# Patient Record
Sex: Female | Born: 1945 | Race: Black or African American | Hispanic: No | Smoking: Never smoker
Health system: Southern US, Community
[De-identification: ages and names within clinical notes are randomized; demographics above are authoritative.]

## PROBLEM LIST (undated history)

## (undated) DIAGNOSIS — M1990 Unspecified osteoarthritis, unspecified site: Secondary | ICD-10-CM

## (undated) HISTORY — PX: NO PAST SURGERIES: SHX2092

---

## 2016-02-19 ENCOUNTER — Ambulatory Visit: Payer: Self-pay

## 2016-02-19 ENCOUNTER — Ambulatory Visit (INDEPENDENT_AMBULATORY_CARE_PROVIDER_SITE_OTHER): Payer: PPO | Admitting: Urgent Care

## 2016-02-19 ENCOUNTER — Ambulatory Visit (INDEPENDENT_AMBULATORY_CARE_PROVIDER_SITE_OTHER): Payer: PPO

## 2016-02-19 VITALS — BP 120/76 | HR 84 | Temp 97.9°F | Resp 16 | Ht 63.5 in | Wt 156.0 lb

## 2016-02-19 DIAGNOSIS — M1612 Unilateral primary osteoarthritis, left hip: Secondary | ICD-10-CM

## 2016-02-19 DIAGNOSIS — M25552 Pain in left hip: Secondary | ICD-10-CM | POA: Diagnosis not present

## 2016-02-19 DIAGNOSIS — M1712 Unilateral primary osteoarthritis, left knee: Secondary | ICD-10-CM

## 2016-02-19 DIAGNOSIS — M179 Osteoarthritis of knee, unspecified: Secondary | ICD-10-CM

## 2016-02-19 DIAGNOSIS — M25562 Pain in left knee: Secondary | ICD-10-CM

## 2016-02-19 LAB — POCT GLYCOSYLATED HEMOGLOBIN (HGB A1C): HEMOGLOBIN A1C: 6

## 2016-02-19 MED ORDER — PREDNISONE 20 MG PO TABS: ORAL_TABLET | ORAL | Status: AC

## 2016-02-19 NOTE — Progress Notes (Signed)
MRN: [id number] DOB: June 05, 1946  Subjective:   Julia Vargas is a 70 y.o. female presenting for chief complaint of Hip Pain  Reports several year history of left hip pain. The pain has worsened over time, is like a grinding type sensation, aggravated by long periods of standing. Had a fall on 10/2015, lost footing and fell onto grass, no complications. Also has left knee pain, is intermittent, less painful than her hip pain. Has used glucosamine and Tylenol daily for her pain with some relief of her pain. Denies fever, trauma, history of fractures. Denies history of diabetes.  Julia Vargas has a current medication list which includes the following prescription(s): acetaminophen and glucosamine-chondroitin. Also has No Known Allergies.  Julia Vargas  has no past medical history on file. Also  has no past surgical history on file.  Her family history includes COPD in her father.   Objective:   Vitals: BP 120/76 mmHg  Pulse 84  Temp(Src) 97.9 F (36.6 C) (Oral)  Resp 16  Ht 5' 3.5" (1.613 m)  Wt 156 lb (70.761 kg)  BMI 27.20 kg/m2  SpO2 96%  Physical Exam  Constitutional: She is oriented to person, place, and time. She appears well-developed and well-nourished.  Cardiovascular: Normal rate.   Pulmonary/Chest: Effort normal.  Musculoskeletal:       Left hip: She exhibits decreased range of motion (flexion, external rotation) and tenderness (lateral hip). She exhibits normal strength, no swelling, no crepitus, no deformity and no laceration.       Left knee: She exhibits normal range of motion, no swelling, no effusion, no ecchymosis, no deformity, no laceration, no erythema, normal alignment, no LCL laxity, normal patellar mobility and no bony tenderness. No tenderness found.  Neurological: She is alert and oriented to person, place, and time. She has normal reflexes.  Skin: Skin is warm and dry.   Dg Knee Complete 4 Views Left  02/19/2016  CLINICAL DATA:  70 year old female with left knee  pain. EXAM: LEFT KNEE - COMPLETE 4+ VIEW COMPARISON:  None. FINDINGS: There is no acute fracture or dislocation. There is mild osteopenia. There is mild to moderate osteoarthritic changes with tricompartmental narrowing and bone spurring. There is no joint effusion. The soft tissues appear unremarkable. IMPRESSION: No acute fracture or dislocation. Osteoarthritic changes. Electronically Signed   By: Elgie CollardArash  Radparvar M.D.   On: 02/19/2016 19:01   Dg Hip Unilat W Or W/o Pelvis 2-3 Views Left  02/19/2016  CLINICAL DATA:  Left hip pain EXAM: DG HIP (WITH OR WITHOUT PELVIS) 2-3V LEFT COMPARISON:  None. FINDINGS: No acute fracture or dislocation. Severe osteoarthritis of the left hip with a bone-on-bone appearance, bulky marginal osteophytosis and subchondral sclerosis/ cystic changes. Mild osteoarthritis of the right hip. Osteoarthritis of bilateral sacroiliac joints. IMPRESSION: Severe osteoarthritis of the left hip. Electronically Signed   By: Elige KoHetal  Patel   On: 02/19/2016 19:01   Results for orders placed or performed in visit on 02/19/16 (from the past 24 hour(s))  POCT glycosylated hemoglobin (Hb A1C)     Status: Normal   Collection Time: 02/19/16  6:36 PM  Result Value Ref Range   Hemoglobin A1C 6.0    Assessment and Plan :   1. Osteoarthritis of left hip, unspecified osteoarthritis type 2. Osteoarthritis of left knee, unspecified osteoarthritis type 3. Left hip pain 4. Left knee pain - [facility] hip replacement. Start prednisone for severe hip pain. Use Tylenol in the future for osteoarthritic pain. RTC as  needed.  Wallis Bamberg, PA-C [facility] [phone] 02/19/2016 6:18 PM

## 2016-02-19 NOTE — Patient Instructions (Addendum)
Tylenol You may take  every 6 hours or  every 8 hours.    Osteoarthritis Osteoarthritis is a disease that causes soreness and inflammation of a joint. It occurs when the cartilage at the affected joint wears down. Cartilage acts as a cushion, covering the ends of bones where they meet to form a joint. Osteoarthritis is the most common form of arthritis. It often occurs in older people. The joints affected most often by this condition include those in the:  Ends of the fingers.  Thumbs.  Neck.  Lower back.  Knees.  Hips. CAUSES  Over time, the cartilage that covers the ends of bones begins to wear away. This causes bone to rub on bone, producing pain and stiffness in the affected joints.  RISK FACTORS Certain factors can increase your chances of having osteoarthritis, including:  Older age.  Excessive body weight.  Overuse of joints.  Previous joint injury. SIGNS AND SYMPTOMS   Pain, swelling, and stiffness in the joint.  Over time, the joint may lose its normal shape.  Small deposits of bone (osteophytes) may grow on the edges of the joint.  Bits of bone or cartilage can break off and float inside the joint space. This may cause more pain and damage. DIAGNOSIS  Your health care provider will do a physical exam and ask about your symptoms. Various tests may be ordered, such as:  X-rays of the affected joint.  Blood tests to rule out other types of arthritis. Additional tests may be used to diagnose your condition. TREATMENT  Goals of treatment are to control pain and improve joint function. Treatment plans may include:  A prescribed exercise program that allows for rest and joint relief.  A weight control plan.  Pain relief techniques, such as:  Properly applied heat and cold.  Electric pulses delivered to nerve endings under the skin (transcutaneous electrical nerve stimulation [TENS]).  Massage.  Certain nutritional supplements.  Medicines to  control pain, such as:  Acetaminophen.  Nonsteroidal anti-inflammatory drugs (NSAIDs), such as naproxen.  Narcotic or central-acting agents, such as tramadol.  Corticosteroids. These can be given orally or as an injection.  Surgery to reposition the bones and relieve pain (osteotomy) or to remove loose pieces of bone and cartilage. Joint replacement may be needed in advanced states of osteoarthritis. HOME CARE INSTRUCTIONS   Take medicines only as directed by your health care provider.  Maintain a healthy weight. Follow your health care provider's instructions for weight control. This may include dietary instructions.  Exercise as directed. Your health care provider can recommend specific types of exercise. These may include:  Strengthening exercises. These are done to strengthen the muscles that support joints affected by arthritis. They can be performed with weights or with exercise bands to add resistance.  Aerobic activities. These are exercises, such as brisk walking or low-impact aerobics, that get your heart pumping.  Range-of-motion activities. These keep your joints limber.  Balance and agility exercises. These help you maintain daily living skills.  Rest your affected joints as directed by your health care provider.  Keep all follow-up visits as directed by your health care provider. SEEK MEDICAL CARE IF:   Your skin turns red.  You develop a rash in addition to your joint pain.  You have worsening joint pain.  You have a fever along with joint or muscle aches. SEEK IMMEDIATE MEDICAL CARE IF:  You have a significant loss of weight or appetite.  You have night sweats. FOR MORE INFORMATION  [employer] Arthritis and Musculoskeletal and Skin Diseases: www.niams.http://www.myers.net/nih.gov  [employer] Aging: https://walker.com/www.nia.nih.gov  American College of Rheumatology: www.rheumatology.org   This information is not intended to replace advice given to you by your  health care provider. Make sure you discuss any questions you have with your health care provider.   Document Released: 07/29/2005 Document Revised: 08/19/2014 Document Reviewed: 04/05/2013 Elsevier Interactive Patient Education 2016 [employer]     IF you received an x-ray today, you will receive an invoice from [facility]. Please contact [facility] at [phone] with questions or concerns regarding your invoice.   IF you received labwork today, you will receive an invoice from [facility]. Please contact [facility] at [phone] with questions or concerns regarding your invoice.   Our billing staff will not be able to assist you with questions regarding bills from these companies.  You will be contacted with the lab results as soon as they are available. The fastest way to get your results is to activate your My Chart account. Instructions are located on the last page of this paperwork. If you have not heard from us regarding the results in 2 weeks, please contact this office.    We recommend that you schedule a mammogram for breast cancer screening. Typically, you do not need a referral to do this. Please contact a local imaging center to schedule your mammogram.  [facility] - [phone]) [phone]  *ask for the Radiology Department [facility]) - [phone] or [phone]) [phone]  [facility] - [phone] [facility] - [phone] [facility] - [phone]  *ask for the Radiology Department [facility] - [phone]) [phone]  *ask for the Radiology Department [facility] - [phone]) [phone]  *ask for the Mammography Department [facility] - [phone](336) [phone]

## 2016-03-28 DIAGNOSIS — M1612 Unilateral primary osteoarthritis, left hip: Secondary | ICD-10-CM | POA: Diagnosis not present

## 2016-03-28 DIAGNOSIS — M25552 Pain in left hip: Secondary | ICD-10-CM | POA: Diagnosis not present

## 2016-04-10 DIAGNOSIS — M25552 Pain in left hip: Secondary | ICD-10-CM | POA: Diagnosis not present

## 2016-04-10 DIAGNOSIS — M1612 Unilateral primary osteoarthritis, left hip: Secondary | ICD-10-CM | POA: Diagnosis not present

## 2016-05-02 NOTE — H&P (Signed)
TOTAL HIP ADMISSION H&P  Patient is admitted for left total hip arthroplasty, anterior approach.  Subjective:  Chief Complaint:   Left hip primary OA / pain  HPI: Julia Vargas, 70 y.o. female, has a history of pain and functional disability in the left hip(s) due to arthritis and patient has failed non-surgical conservative treatments for greater than 12 weeks to include NSAID's and/or analgesics, use of assistive devices and activity modification.  Onset of symptoms was gradual starting 40+ years ago with gradually worsening course since that time.The patient noted no past surgery on the left hip(s).  Patient currently rates pain in the left hip at 9 out of 10 with activity. Patient has night pain, worsening of pain with activity and weight bearing, trendelenberg gait, pain that interfers with activities of daily living and pain with passive range of motion. Patient has evidence of periarticular osteophytes and joint space narrowing by imaging studies. This condition presents safety issues increasing the risk of falls.  There is no current active infection.  Risks, benefits and expectations were discussed with the patient.  Risks including but not limited to the risk of anesthesia, blood clots, nerve damage, blood vessel damage, failure of the prosthesis, infection and up to and including death.  Patient understand the risks, benefits and expectations and wishes to proceed with surgery.   PCP: No PCP Per Patient  D/C Plans:      Home  Post-op Meds:       No Rx given  Tranexamic Acid:      To be given - IV   Decadron:      Is to be given  FYI:     ASA  Norco  ** NO BLOOD PRODUCTS **     Past Medical History:  Diagnosis Date  . Arthritis     Past Surgical History:  Procedure Laterality Date  . NO PAST SURGERIES      No prescriptions prior to admission.   No Known Allergies   Social History  Substance Use Topics  . Smoking status: Never Smoker  . Smokeless tobacco: Never Used   . Alcohol use No       Review of Systems  Constitutional: Negative.   HENT: Negative.   Eyes: Negative.   Respiratory: Negative.   Cardiovascular: Negative.   Gastrointestinal: Negative.   Genitourinary: Negative.   Musculoskeletal: Positive for joint pain.  Skin: Negative.   Neurological: Negative.   Endo/Heme/Allergies: Negative.   Psychiatric/Behavioral: Negative.     Objective:  Physical Exam  Constitutional: She is oriented to person, place, and time. She appears well-developed.  HENT:  Head: Normocephalic.  Eyes: Pupils are equal, round, and reactive to light.  Neck: Neck supple. No JVD present. No tracheal deviation present. No thyromegaly present.  Cardiovascular: Normal rate, regular rhythm, normal heart sounds and intact distal pulses.   Respiratory: Effort normal and breath sounds normal. No respiratory distress. She has no wheezes.  GI: Soft. There is no tenderness. There is no guarding.  Musculoskeletal:       Left hip: She exhibits decreased range of motion, decreased strength, tenderness and bony tenderness. She exhibits no swelling, no deformity and no laceration.  Lymphadenopathy:    She has no cervical adenopathy.  Neurological: She is alert and oriented to person, place, and time.  Skin: Skin is warm and dry.  Psychiatric: She has a normal mood and affect.      Imaging Review Plain radiographs demonstrate severe degenerative joint disease of the left  hip(s). The bone quality appears to be good for age and reported activity level.  Assessment/Plan:  End stage arthritis, left hip(s)  The patient history, physical examination, clinical judgement of the provider and imaging studies are consistent with end stage degenerative joint disease of the left hip(s) and total hip arthroplasty is deemed medically necessary. The treatment options including medical management, injection therapy, arthroscopy and arthroplasty were discussed at length. The risks and  benefits of total hip arthroplasty were presented and reviewed. The risks due to aseptic loosening, infection, stiffness, dislocation/subluxation,  thromboembolic complications and other imponderables were discussed.  The patient acknowledged the explanation, agreed to proceed with the plan and consent was signed. Patient is being admitted for inpatient treatment for surgery, pain control, PT, OT, prophylactic antibiotics, VTE prophylaxis, progressive ambulation and ADL's and discharge planning.The patient is planning to be discharged home with home health services.     Anastasio AuerbachMatthew S. Anaiya Wisinski   PA-C  05/13/2016, 7:48 AM

## 2016-05-06 ENCOUNTER — Encounter (HOSPITAL_COMMUNITY)
Admission: RE | Admit: 2016-05-06 | Discharge: 2016-05-06 | Disposition: A | Discharge status: Home / Self Care | Payer: PPO | Source: Ambulatory Visit | Attending: Orthopedic Surgery | Admitting: Orthopedic Surgery

## 2016-05-06 ENCOUNTER — Encounter (HOSPITAL_COMMUNITY): Payer: Self-pay | Admitting: *Deleted

## 2016-05-06 DIAGNOSIS — Z01818 Encounter for other preprocedural examination: Secondary | ICD-10-CM | POA: Diagnosis not present

## 2016-05-06 DIAGNOSIS — Z01812 Encounter for preprocedural laboratory examination: Secondary | ICD-10-CM | POA: Diagnosis not present

## 2016-05-06 DIAGNOSIS — M1612 Unilateral primary osteoarthritis, left hip: Secondary | ICD-10-CM | POA: Diagnosis not present

## 2016-05-06 HISTORY — DX: Unspecified osteoarthritis, unspecified site: M19.90

## 2016-05-06 LAB — BASIC METABOLIC PANEL
Anion gap: 7 (ref 5–15)
BUN: 12 mg/dL (ref 6–20)
CHLORIDE: 108 mmol/L (ref 101–111)
CO2: 27 mmol/L (ref 22–32)
Calcium: 10 mg/dL (ref 8.9–10.3)
Creatinine, Ser: 1.02 mg/dL — ABNORMAL HIGH (ref 0.44–1.00)
GFR calc Af Amer: >60 mL/min (ref >60)
GFR calc non Af Amer: 54 mL/min — ABNORMAL LOW (ref >60)
GLUCOSE: 95 mg/dL (ref 65–99)
POTASSIUM: 3.5 mmol/L (ref 3.5–5.1)
Sodium: 142 mmol/L (ref 135–145)

## 2016-05-06 LAB — CBC
HEMATOCRIT: 42.6 % (ref 36.0–46.0)
Hemoglobin: 13.9 g/dL (ref 12.0–15.0)
MCH: 30.7 pg (ref 26.0–34.0)
MCHC: 32.6 g/dL (ref 30.0–36.0)
MCV: 94 fL (ref 78.0–100.0)
PLATELETS: 178 10*3/uL (ref 150–400)
RBC: 4.53 MIL/uL (ref 3.87–5.11)
RDW: 13.6 % (ref 11.5–15.5)
WBC: 7.4 10*3/uL (ref 4.0–10.5)

## 2016-05-06 LAB — PROTIME-INR
INR: 1.08
Prothrombin Time: 14.1 seconds (ref 11.4–15.2)

## 2016-05-06 LAB — SURGICAL PCR SCREEN
MRSA, PCR: NEGATIVE
Staphylococcus aureus: POSITIVE — AB

## 2016-05-06 NOTE — Progress Notes (Signed)
PCR screening results in epic per PAT visit 05/06/2016 positive for STAPH. Results sent to Dr Charlann Boxerlin. Prescription for Mupriocin Ointment [facility] to [facility]. Pt aware.

## 2016-05-06 NOTE — Patient Instructions (Signed)
Julia Vargas  05/06/2016   Your procedure is scheduled on: Tuesday May 14, 2016  Report to [facility]  Entrance take [place]  elevators to 3rd floor to  Short Stay Center at 8:30  AM.  Call this number if you have problems the morning of surgery [phone]   Remember: ONLY 1 PERSON MAY GO WITH YOU TO SHORT STAY TO GET  READY MORNING OF YOUR SURGERY.  Do not eat food or drink liquids :After Midnight.     Take these medicines the morning of surgery: NONE                                You may not have any metal on your body including hair pins and              piercings  Do not wear jewelry, make-up, lotions, powders or perfumes, deodorant             Do not wear nail polish.  Do not shave  48 hours prior to surgery.                Do not bring valuables to the hospital. [facility] IS NOT             RESPONSIBLE   FOR VALUABLES.  Contacts, dentures or bridgework may not be worn into surgery.  Leave suitcase in the car. After surgery it may be brought to your room.               Please read over the following fact sheets you were given:MRSA INFORMATION SHEET; INCENTIVE SPIROMETER  _____________________________________________________________________             [facility] - Preparing for Surgery Before surgery, you can play an important role.  Because skin is not sterile, your skin needs to be as free of germs as possible.  You can reduce the number of germs on your skin by washing with CHG (chlorahexidine gluconate) soap before surgery.  CHG is an antiseptic cleaner which kills germs and bonds with the skin to continue killing germs even after washing. Please DO NOT use if you have an allergy to CHG or antibacterial soaps.  If your skin becomes reddened/irritated stop using the CHG and inform your nurse when you arrive at Short Stay. Do not shave (including legs and underarms) for at least 48 hours prior to the first CHG shower.  You may shave your  face/neck. Please follow these instructions carefully:  1.  Shower with CHG Soap the night before surgery and the  morning of Surgery.  2.  If you choose to wash your hair, wash your hair first as usual with your  normal  shampoo.  3.  After you shampoo, rinse your hair and body thoroughly to remove the  shampoo.                           4.  Use CHG as you would any other liquid soap.  You can apply chg directly  to the skin and wash                       Gently with a scrungie or clean washcloth.  5.  Apply the CHG Soap to your body ONLY FROM THE  NECK DOWN.   Do not use on face/ open                           Wound or open sores. Avoid contact with eyes, ears mouth and genitals (private parts).                       Wash face,  Genitals (private parts) with your normal soap.             6.  Wash thoroughly, paying special attention to the area where your surgery  will be performed.  7.  Thoroughly rinse your body with warm water from the neck down.  8.  DO NOT shower/wash with your normal soap after using and rinsing off  the CHG Soap.                9.  Pat yourself dry with a clean towel.            10.  Wear clean pajamas.            11.  Place clean sheets on your bed the night of your first shower and do not  sleep with pets. Day of Surgery : Do not apply any lotions/deodorants the morning of surgery.  Please wear clean clothes to the hospital/surgery center.  FAILURE TO FOLLOW THESE INSTRUCTIONS MAY RESULT IN THE CANCELLATION OF YOUR SURGERY PATIENT SIGNATURE_________________________________  NURSE SIGNATURE__________________________________  ________________________________________________________________________   Adam Phenix  An incentive spirometer is a tool that can help keep your lungs clear and active. This tool measures how well you are filling your lungs with each breath. Taking long deep breaths may help reverse or decrease the chance of developing breathing  (pulmonary) problems (especially infection) following:  A long period of time when you are unable to move or be active. BEFORE THE PROCEDURE   If the spirometer includes an indicator to show your best effort, your nurse or respiratory therapist will set it to a desired goal.  If possible, sit up straight or lean slightly forward. Try not to slouch.  Hold the incentive spirometer in an upright position. INSTRUCTIONS FOR USE  1. Sit on the edge of your bed if possible, or sit up as far as you can in bed or on a chair. 2. Hold the incentive spirometer in an upright position. 3. Breathe out normally. 4. Place the mouthpiece in your mouth and seal your lips tightly around it. 5. Breathe in slowly and as deeply as possible, raising the piston or the ball toward the top of the column. 6. Hold your breath for 3-5 seconds or for as long as possible. Allow the piston or ball to fall to the bottom of the column. 7. Remove the mouthpiece from your mouth and breathe out normally. 8. Rest for a few seconds and repeat Steps 1 through 7 at least 10 times every 1-2 hours when you are awake. Take your time and take a few normal breaths between deep breaths. 9. The spirometer may include an indicator to show your best effort. Use the indicator as a goal to work toward during each repetition. 10. After each set of 10 deep breaths, practice coughing to be sure your lungs are clear. If you have an incision (the cut made at the time of surgery), support your incision when coughing by placing a pillow or rolled up towels firmly against it. Once you are able  to get out of bed, walk around indoors and cough well. You may stop using the incentive spirometer when instructed by your caregiver.  RISKS AND COMPLICATIONS  Take your time so you do not get dizzy or light-headed.  If you are in pain, you may need to take or ask for pain medication before doing incentive spirometry. It is harder to take a deep breath if you  are having pain. AFTER USE  Rest and breathe slowly and easily.  It can be helpful to keep track of a log of your progress. Your caregiver can provide you with a simple table to help with this. If you are using the spirometer at home, follow these instructions: SEEK MEDICAL CARE IF:   You are having difficultly using the spirometer.  You have trouble using the spirometer as often as instructed.  Your pain medication is not giving enough relief while using the spirometer.  You develop fever of 100.5 F (38.1 C) or higher. SEEK IMMEDIATE MEDICAL CARE IF:   You cough up bloody sputum that had not been present before.  You develop fever of 102 F (38.9 C) or greater.  You develop worsening pain at or near the incision site. MAKE SURE YOU:   Understand these instructions.  Will watch your condition.  Will get help right away if you are not doing well or get worse. Document Released: 12/09/2006 Document Revised: 10/21/2011 Document Reviewed: 02/09/2007 [facility] Patient Information 2014 [employer] [place].   ________________________________________________________________________

## 2016-05-06 NOTE — Progress Notes (Signed)
Pt did not sign blood refusal consent during PAT visit 05/06/2016. Pt stated she needed to decide on albumin / albumin products prior to signing; pt will need to sign prior to surgical procedure.

## 2016-05-14 ENCOUNTER — Inpatient Hospital Stay (HOSPITAL_COMMUNITY): Payer: PPO | Admitting: Certified Registered Nurse Anesthetist

## 2016-05-14 ENCOUNTER — Inpatient Hospital Stay (HOSPITAL_COMMUNITY)
Admission: RE | Admit: 2016-05-14 | Discharge: 2016-05-16 | DRG: 470 | Disposition: A | Discharge status: Home Health | Payer: PPO | Source: Ambulatory Visit | Attending: Orthopedic Surgery | Admitting: Orthopedic Surgery

## 2016-05-14 ENCOUNTER — Inpatient Hospital Stay (HOSPITAL_COMMUNITY): Payer: PPO

## 2016-05-14 ENCOUNTER — Encounter (HOSPITAL_COMMUNITY): Payer: Self-pay | Admitting: *Deleted

## 2016-05-14 ENCOUNTER — Encounter (HOSPITAL_COMMUNITY)
Admission: RE | Disposition: A | Discharge status: Home Health | Payer: Self-pay | Source: Ambulatory Visit | Attending: Orthopedic Surgery

## 2016-05-14 DIAGNOSIS — M1612 Unilateral primary osteoarthritis, left hip: Secondary | ICD-10-CM | POA: Diagnosis not present

## 2016-05-14 DIAGNOSIS — Z23 Encounter for immunization: Secondary | ICD-10-CM

## 2016-05-14 DIAGNOSIS — M25552 Pain in left hip: Secondary | ICD-10-CM | POA: Diagnosis not present

## 2016-05-14 DIAGNOSIS — Z471 Aftercare following joint replacement surgery: Secondary | ICD-10-CM | POA: Diagnosis not present

## 2016-05-14 DIAGNOSIS — Z96649 Presence of unspecified artificial hip joint: Secondary | ICD-10-CM

## 2016-05-14 DIAGNOSIS — Z96642 Presence of left artificial hip joint: Secondary | ICD-10-CM | POA: Diagnosis not present

## 2016-05-14 DIAGNOSIS — E663 Overweight: Secondary | ICD-10-CM | POA: Diagnosis present

## 2016-05-14 DIAGNOSIS — Z6827 Body mass index (BMI) 27.0-27.9, adult: Secondary | ICD-10-CM | POA: Diagnosis not present

## 2016-05-14 HISTORY — PX: TOTAL HIP ARTHROPLASTY: SHX124

## 2016-05-14 SURGERY — ARTHROPLASTY, HIP, TOTAL, ANTERIOR APPROACH
Anesthesia: Spinal | Site: Hip | Laterality: Left

## 2016-05-14 MED ORDER — CHLORHEXIDINE GLUCONATE 4 % EX LIQD: 60.0000 mL | Freq: Once | CUTANEOUS | Status: DC

## 2016-05-14 MED ORDER — HYDROMORPHONE HCL 1 MG/ML IJ SOLN: 0.2500 mg | INTRAMUSCULAR | Status: DC | PRN

## 2016-05-14 MED ORDER — CEFAZOLIN SODIUM-DEXTROSE 2-4 GM/100ML-% IV SOLN
2.0000 g | Freq: Four times a day (QID) | INTRAVENOUS | Status: AC
  Administered 2016-05-14 (×2): 2 g via INTRAVENOUS
  Filled 2016-05-14 (×2): qty 100

## 2016-05-14 MED ORDER — ONDANSETRON HCL 4 MG/2ML IJ SOLN: 4.0000 mg | Freq: Four times a day (QID) | INTRAMUSCULAR | Status: DC | PRN

## 2016-05-14 MED ORDER — ALUM & MAG HYDROXIDE-SIMETH 200-200-20 MG/5ML PO SUSP: 30.0000 mL | ORAL | Status: DC | PRN

## 2016-05-14 MED ORDER — DOCUSATE SODIUM 100 MG PO CAPS
100.0000 mg | ORAL_CAPSULE | Freq: Two times a day (BID) | ORAL | Status: DC
  Administered 2016-05-14 – 2016-05-15 (×4): 100 mg via ORAL
  Filled 2016-05-14 (×5): qty 1

## 2016-05-14 MED ORDER — POLYETHYLENE GLYCOL 3350 17 G PO PACK
17.0000 g | PACK | Freq: Two times a day (BID) | ORAL | Status: DC
  Administered 2016-05-14 – 2016-05-15 (×4): 17 g via ORAL
  Filled 2016-05-14 (×5): qty 1

## 2016-05-14 MED ORDER — CEFAZOLIN SODIUM-DEXTROSE 2-4 GM/100ML-% IV SOLN
2.0000 g | INTRAVENOUS | Status: AC
  Administered 2016-05-14: 2 g via INTRAVENOUS
  Filled 2016-05-14: qty 100

## 2016-05-14 MED ORDER — BUPIVACAINE HCL (PF) 0.5 % IJ SOLN
INTRAMUSCULAR | Status: DC | PRN
  Administered 2016-05-14: 3 mL

## 2016-05-14 MED ORDER — HYDROCODONE-ACETAMINOPHEN 7.5-325 MG PO TABS
1.0000 | ORAL_TABLET | ORAL | Status: DC
  Administered 2016-05-14 – 2016-05-15 (×4): 1 via ORAL
  Administered 2016-05-15: 2 via ORAL
  Administered 2016-05-15: 1 via ORAL
  Administered 2016-05-15: 2 via ORAL
  Administered 2016-05-16 (×2): 1 via ORAL
  Filled 2016-05-14: qty 1
  Filled 2016-05-14 (×2): qty 2
  Filled 2016-05-14: qty 1
  Filled 2016-05-14 (×2): qty 2
  Filled 2016-05-14: qty 1
  Filled 2016-05-14: qty 2

## 2016-05-14 MED ORDER — PHENYLEPHRINE 40 MCG/ML (10ML) SYRINGE FOR IV PUSH (FOR BLOOD PRESSURE SUPPORT)
PREFILLED_SYRINGE | INTRAVENOUS | Status: AC
Start: 2016-05-14 — End: 2016-05-14
  Filled 2016-05-14: qty 10

## 2016-05-14 MED ORDER — PHENOL 1.4 % MT LIQD: 1.0000 | OROMUCOSAL | Status: DC | PRN

## 2016-05-14 MED ORDER — GLYCOPYRROLATE 0.2 MG/ML IJ SOLN
INTRAMUSCULAR | Status: DC | PRN
  Administered 2016-05-14: 0.2 mg via INTRAVENOUS

## 2016-05-14 MED ORDER — PREDNISONE 20 MG PO TABS
40.0000 mg | ORAL_TABLET | Freq: Every day | ORAL | Status: DC
  Administered 2016-05-16: 40 mg via ORAL
  Filled 2016-05-14 (×2): qty 2

## 2016-05-14 MED ORDER — BISACODYL 10 MG RE SUPP: 10.0000 mg | Freq: Every day | RECTAL | Status: DC | PRN

## 2016-05-14 MED ORDER — PHENYLEPHRINE HCL 10 MG/ML IJ SOLN
INTRAVENOUS | Status: DC | PRN
  Administered 2016-05-14: 40 ug/min via INTRAVENOUS

## 2016-05-14 MED ORDER — PHENYLEPHRINE HCL 10 MG/ML IJ SOLN
INTRAMUSCULAR | Status: DC | PRN
  Administered 2016-05-14 (×4): 40 ug via INTRAVENOUS
  Administered 2016-05-14 (×3): 80 ug via INTRAVENOUS
  Administered 2016-05-14 (×2): 40 ug via INTRAVENOUS
  Administered 2016-05-14: 80 ug via INTRAVENOUS
  Administered 2016-05-14 (×2): 40 ug via INTRAVENOUS

## 2016-05-14 MED ORDER — PROPOFOL 10 MG/ML IV BOLUS
INTRAVENOUS | Status: DC | PRN
  Administered 2016-05-14 (×4): 20 mg via INTRAVENOUS

## 2016-05-14 MED ORDER — FENTANYL CITRATE (PF) 100 MCG/2ML IJ SOLN
INTRAMUSCULAR | Status: AC
  Filled 2016-05-14: qty 2

## 2016-05-14 MED ORDER — ONDANSETRON HCL 4 MG PO TABS: 4.0000 mg | ORAL_TABLET | Freq: Four times a day (QID) | ORAL | Status: DC | PRN

## 2016-05-14 MED ORDER — MIDAZOLAM HCL 5 MG/5ML IJ SOLN
INTRAMUSCULAR | Status: DC | PRN
  Administered 2016-05-14: 2 mg via INTRAVENOUS

## 2016-05-14 MED ORDER — SODIUM CHLORIDE 0.9 % IR SOLN
Status: DC | PRN
  Administered 2016-05-14: 1000 mL

## 2016-05-14 MED ORDER — BUPIVACAINE HCL (PF) 0.5 % IJ SOLN
INTRAMUSCULAR | Status: AC
  Filled 2016-05-14: qty 30

## 2016-05-14 MED ORDER — EPHEDRINE 5 MG/ML INJ
INTRAVENOUS | Status: AC
  Filled 2016-05-14: qty 10

## 2016-05-14 MED ORDER — ONDANSETRON HCL 4 MG/2ML IJ SOLN
INTRAMUSCULAR | Status: DC | PRN
  Administered 2016-05-14: 4 mg via INTRAVENOUS

## 2016-05-14 MED ORDER — METOCLOPRAMIDE HCL 5 MG/ML IJ SOLN: 5.0000 mg | Freq: Three times a day (TID) | INTRAMUSCULAR | Status: DC | PRN

## 2016-05-14 MED ORDER — FENTANYL CITRATE (PF) 100 MCG/2ML IJ SOLN
INTRAMUSCULAR | Status: DC | PRN
  Administered 2016-05-14 (×2): 25 ug via INTRAVENOUS

## 2016-05-14 MED ORDER — METHOCARBAMOL 1000 MG/10ML IJ SOLN
500.0000 mg | Freq: Four times a day (QID) | INTRAVENOUS | Status: DC | PRN
  Filled 2016-05-14: qty 5

## 2016-05-14 MED ORDER — PROMETHAZINE HCL 25 MG/ML IJ SOLN: 6.2500 mg | INTRAMUSCULAR | Status: DC | PRN

## 2016-05-14 MED ORDER — PHENYLEPHRINE 40 MCG/ML (10ML) SYRINGE FOR IV PUSH (FOR BLOOD PRESSURE SUPPORT)
PREFILLED_SYRINGE | INTRAVENOUS | Status: AC
  Filled 2016-05-14: qty 10

## 2016-05-14 MED ORDER — SODIUM CHLORIDE 0.9 % IV SOLN
1000.0000 mg | INTRAVENOUS | Status: AC
  Administered 2016-05-14: 1000 mg via INTRAVENOUS
  Filled 2016-05-14: qty 10

## 2016-05-14 MED ORDER — PROPOFOL 10 MG/ML IV BOLUS
INTRAVENOUS | Status: AC
  Filled 2016-05-14: qty 60

## 2016-05-14 MED ORDER — MIDAZOLAM HCL 2 MG/2ML IJ SOLN
INTRAMUSCULAR | Status: AC
  Filled 2016-05-14: qty 2

## 2016-05-14 MED ORDER — METOCLOPRAMIDE HCL 5 MG PO TABS: 5.0000 mg | ORAL_TABLET | Freq: Three times a day (TID) | ORAL | Status: DC | PRN

## 2016-05-14 MED ORDER — DEXAMETHASONE SODIUM PHOSPHATE 10 MG/ML IJ SOLN
INTRAMUSCULAR | Status: AC
  Filled 2016-05-14: qty 1

## 2016-05-14 MED ORDER — STERILE WATER FOR IRRIGATION IR SOLN
Status: DC | PRN
  Administered 2016-05-14: 2000 mL

## 2016-05-14 MED ORDER — DEXAMETHASONE SODIUM PHOSPHATE 10 MG/ML IJ SOLN
10.0000 mg | Freq: Once | INTRAMUSCULAR | Status: AC
  Administered 2016-05-15: 10 mg via INTRAVENOUS
  Filled 2016-05-14: qty 1

## 2016-05-14 MED ORDER — ASPIRIN 81 MG PO CHEW
81.0000 mg | CHEWABLE_TABLET | Freq: Two times a day (BID) | ORAL | Status: DC
  Administered 2016-05-14 – 2016-05-16 (×4): 81 mg via ORAL
  Filled 2016-05-14 (×4): qty 1

## 2016-05-14 MED ORDER — DIPHENHYDRAMINE HCL 25 MG PO CAPS
25.0000 mg | ORAL_CAPSULE | Freq: Four times a day (QID) | ORAL | Status: DC | PRN
  Administered 2016-05-14: 25 mg via ORAL
  Filled 2016-05-14: qty 1

## 2016-05-14 MED ORDER — POTASSIUM CHLORIDE 2 MEQ/ML IV SOLN
100.0000 mL/h | INTRAVENOUS | Status: DC
  Administered 2016-05-14: 100 mL/h via INTRAVENOUS
  Filled 2016-05-14 (×6): qty 1000

## 2016-05-14 MED ORDER — CEFAZOLIN SODIUM-DEXTROSE 2-4 GM/100ML-% IV SOLN
INTRAVENOUS | Status: AC
  Filled 2016-05-14: qty 100

## 2016-05-14 MED ORDER — PHENYLEPHRINE HCL 10 MG/ML IJ SOLN
INTRAMUSCULAR | Status: AC
Start: 2016-05-14 — End: 2016-05-14
  Filled 2016-05-14: qty 1

## 2016-05-14 MED ORDER — MENTHOL 3 MG MT LOZG: 1.0000 | LOZENGE | OROMUCOSAL | Status: DC | PRN

## 2016-05-14 MED ORDER — CELECOXIB 200 MG PO CAPS
200.0000 mg | ORAL_CAPSULE | Freq: Two times a day (BID) | ORAL | Status: DC
  Administered 2016-05-14 – 2016-05-16 (×5): 200 mg via ORAL
  Filled 2016-05-14 (×5): qty 1

## 2016-05-14 MED ORDER — METHOCARBAMOL 500 MG PO TABS
500.0000 mg | ORAL_TABLET | Freq: Four times a day (QID) | ORAL | Status: DC | PRN
  Administered 2016-05-14 – 2016-05-15 (×2): 500 mg via ORAL
  Filled 2016-05-14 (×2): qty 1

## 2016-05-14 MED ORDER — EPHEDRINE SULFATE 50 MG/ML IJ SOLN
INTRAMUSCULAR | Status: DC | PRN
  Administered 2016-05-14: 10 mg via INTRAVENOUS

## 2016-05-14 MED ORDER — FERROUS SULFATE 325 (65 FE) MG PO TABS
325.0000 mg | ORAL_TABLET | Freq: Three times a day (TID) | ORAL | Status: DC
  Administered 2016-05-14 – 2016-05-16 (×5): 325 mg via ORAL
  Filled 2016-05-14 (×5): qty 1

## 2016-05-14 MED ORDER — MAGNESIUM CITRATE PO SOLN: 1.0000 | Freq: Once | ORAL | Status: DC | PRN

## 2016-05-14 MED ORDER — DEXAMETHASONE SODIUM PHOSPHATE 10 MG/ML IJ SOLN
10.0000 mg | Freq: Once | INTRAMUSCULAR | Status: AC
  Administered 2016-05-14: 10 mg via INTRAVENOUS

## 2016-05-14 MED ORDER — HYDROMORPHONE HCL 1 MG/ML IJ SOLN
0.5000 mg | INTRAMUSCULAR | Status: DC | PRN
  Filled 2016-05-14: qty 1

## 2016-05-14 MED ORDER — LACTATED RINGERS IV SOLN
INTRAVENOUS | Status: DC
  Administered 2016-05-14 (×3): via INTRAVENOUS

## 2016-05-14 MED ORDER — ONDANSETRON HCL 4 MG/2ML IJ SOLN
INTRAMUSCULAR | Status: AC
  Filled 2016-05-14: qty 2

## 2016-05-14 MED ORDER — PROPOFOL 500 MG/50ML IV EMUL
INTRAVENOUS | Status: DC | PRN
  Administered 2016-05-14: 40 ug/kg/min via INTRAVENOUS

## 2016-05-14 SURGICAL SUPPLY — 36 items
BAG ZIPLOCK 12X15 (MISCELLANEOUS) ×3 IMPLANT
CAP HIP TOTAL 2 ×3 IMPLANT
CLOTH BEACON ORANGE TIMEOUT ST (SAFETY) ×3 IMPLANT
COVER PERINEAL POST (MISCELLANEOUS) ×3 IMPLANT
DERMABOND ADVANCED (GAUZE/BANDAGES/DRESSINGS) ×2
DERMABOND ADVANCED .7 DNX12 (GAUZE/BANDAGES/DRESSINGS) ×1 IMPLANT
DRAPE STERI IOBAN 125X83 (DRAPES) ×3 IMPLANT
DRAPE U-SHAPE 47X51 STRL (DRAPES) ×6 IMPLANT
DRESSING AQUACEL AG SP 3.5X10 (GAUZE/BANDAGES/DRESSINGS) ×1 IMPLANT
DRSG AQUACEL AG SP 3.5X10 (GAUZE/BANDAGES/DRESSINGS) ×3
DURAPREP 26ML APPLICATOR (WOUND CARE) ×3 IMPLANT
ELECT REM PT RETURN 9FT ADLT (ELECTROSURGICAL) ×3
ELECTRODE REM PT RTRN 9FT ADLT (ELECTROSURGICAL) ×1 IMPLANT
GLOVE BIOGEL PI IND STRL 7.0 (GLOVE) ×2 IMPLANT
GLOVE BIOGEL PI IND STRL 7.5 (GLOVE) ×3 IMPLANT
GLOVE BIOGEL PI IND STRL 8 (GLOVE) ×1 IMPLANT
GLOVE BIOGEL PI INDICATOR 7.0 (GLOVE) ×4
GLOVE BIOGEL PI INDICATOR 7.5 (GLOVE) ×6
GLOVE BIOGEL PI INDICATOR 8 (GLOVE) ×2
GLOVE ECLIPSE 8.0 STRL XLNG CF (GLOVE) ×6 IMPLANT
GLOVE ORTHO TXT STRL SZ7.5 (GLOVE) ×3 IMPLANT
GLOVE SURG ORTHO 8.0 STRL STRW (GLOVE) ×3 IMPLANT
GLOVE SURG SS PI 6.5 STRL IVOR (GLOVE) ×3 IMPLANT
GLOVE SURG SS PI 7.0 STRL IVOR (GLOVE) ×3 IMPLANT
GOWN STRL REUS W/TWL LRG LVL3 (GOWN DISPOSABLE) ×6 IMPLANT
GOWN STRL REUS W/TWL XL LVL3 (GOWN DISPOSABLE) ×6 IMPLANT
HOLDER FOLEY CATH W/STRAP (MISCELLANEOUS) ×3 IMPLANT
PACK ANTERIOR HIP CUSTOM (KITS) ×3 IMPLANT
SAW OSC TIP CART 19.5X105X1.3 (SAW) ×3 IMPLANT
SUT MNCRL AB 4-0 PS2 18 (SUTURE) ×3 IMPLANT
SUT VIC AB 1 CT1 36 (SUTURE) ×9 IMPLANT
SUT VIC AB 2-0 CT1 27 (SUTURE) ×6
SUT VIC AB 2-0 CT1 TAPERPNT 27 (SUTURE) ×3 IMPLANT
SUT VLOC 180 0 24IN GS25 (SUTURE) ×3 IMPLANT
TRAY FOLEY CATH SILVER 14FR (SET/KITS/TRAYS/PACK) ×3 IMPLANT
YANKAUER SUCT BULB TIP 10FT TU (MISCELLANEOUS) ×3 IMPLANT

## 2016-05-14 NOTE — Progress Notes (Signed)
Left hip xray done per order

## 2016-05-14 NOTE — Anesthesia Preprocedure Evaluation (Addendum)
Anesthesia Evaluation  Patient identified by MRN, date of birth, ID band Patient awake    Reviewed: Allergy & Precautions, NPO status , Patient's Chart, lab work & pertinent test results  Airway Mallampati: II  TM Distance: >3 FB Neck ROM: Full    Dental no notable dental hx.    Pulmonary neg pulmonary ROS,    Pulmonary exam normal breath sounds clear to auscultation       Cardiovascular negative cardio ROS Normal cardiovascular exam Rhythm:Regular Rate:Normal     Neuro/Psych negative neurological ROS  negative psych ROS   GI/Hepatic negative GI ROS, Neg liver ROS,   Endo/Other  negative endocrine ROS  Renal/GU negative Renal ROS  negative genitourinary   Musculoskeletal  (+) Arthritis ,   Abdominal   Peds negative pediatric ROS (+)  Hematology negative hematology ROS (+)   Anesthesia Other Findings   Reproductive/Obstetrics negative OB ROS                             Anesthesia Physical Anesthesia Plan  ASA: II  Anesthesia Plan: Spinal   Post-op Pain Management:    Induction: Intravenous  Airway Management Planned: Natural Airway  Additional Equipment:   Intra-op Plan:   Post-operative Plan:   Informed Consent: I have reviewed the patients History and Physical, chart, labs and discussed the procedure including the risks, benefits and alternatives for the proposed anesthesia with the patient or authorized representative who has indicated his/her understanding and acceptance.   Dental advisory given  Plan Discussed with: CRNA  Anesthesia Plan Comments: (Discussed risks and benefits of and differences between spinal and general. Discussed risks of spinal including headache, backache, failure, bleeding and hematoma, infection, and nerve damage. Patient consents to spinal. Questions answered. Coagulation studies and platelet count acceptable.)        Anesthesia Quick  Evaluation

## 2016-05-14 NOTE — Progress Notes (Signed)
Pt has noted prolasped organ from rectal area. Dr. Charlann Boxerlin at bed-side and will proceed with surgery.

## 2016-05-14 NOTE — Anesthesia Procedure Notes (Signed)
Spinal  Patient location during procedure: OR Start time: 05/14/2016 11:05 AM End time: 05/14/2016 11:10 AM Staffing Resident/CRNA: Kizzie FantasiaARVER, Esker Dever J Performed: resident/CRNA  Preanesthetic Checklist Completed: patient identified, site marked, surgical consent, pre-op evaluation, timeout performed, IV checked, risks and benefits discussed and monitors and equipment checked Spinal Block Patient position: sitting Prep: Betadine Patient monitoring: heart rate Approach: midline Location: L3-4 Injection technique: single-shot Needle Needle type: Spinocan  Needle gauge: 22 G Needle length: 9 cm Needle insertion depth: 7 cm Additional Notes Sterile prep and drape, pt sitting position, negative paresthesia, negative heme.

## 2016-05-14 NOTE — Transfer of Care (Signed)
Immediate Anesthesia Transfer of Care Note  Patient: Julia Vargas  Procedure(s) Performed: Procedure(s): LEFT TOTAL HIP ARTHROPLASTY ANTERIOR APPROACH (Left)  Patient Location: PACU  Anesthesia Type:Spinal  Level of Consciousness:  sedated, patient cooperative and responds to stimulation  Airway & Oxygen Therapy:Patient Spontanous Breathing and Patient connected to face mask oxgen  Post-op Assessment:  Report given to PACU RN and Post -op Vital signs reviewed and stable  Post vital signs:  Reviewed and stable  Last Vitals:  Vitals:   05/14/16 0755 05/14/16 0825  BP: (!) 151/103 (!) 154/90  Pulse: 88   Resp: 16   Temp: 36.4 C     Complications: No apparent anesthesia complications

## 2016-05-14 NOTE — Anesthesia Postprocedure Evaluation (Signed)
Anesthesia Post Note  Patient: Julia Vargas  Procedure(s) Performed: Procedure(s) (LRB): LEFT TOTAL HIP ARTHROPLASTY ANTERIOR APPROACH (Left)  Patient location during evaluation: PACU Anesthesia Type: Spinal Level of consciousness: oriented and awake and alert Pain management: pain level controlled Vital Signs Assessment: post-procedure vital signs reviewed and stable Respiratory status: spontaneous breathing, respiratory function stable and patient connected to nasal cannula oxygen Cardiovascular status: blood pressure returned to baseline and stable Postop Assessment: no headache, no backache, spinal receding and patient able to bend at knees Anesthetic complications: no    Last Vitals:  Vitals:   05/14/16 1415 05/14/16 1430  BP: 114/74 117/73  Pulse: 69 (!) 59  Resp: 20 (!) 21  Temp:  36.3 C    Last Pain:  Vitals:   05/14/16 1430  TempSrc:   PainSc: 0-No pain                 Pearly Bartosik J

## 2016-05-14 NOTE — Interval H&P Note (Signed)
History and Physical Interval Note:  05/14/2016 10:02 AM  Julia Vargas  has presented today for surgery, with the diagnosis of LEFT HIP OA  The various methods of treatment have been discussed with the patient and family. After consideration of risks, benefits and other options for treatment, the patient has consented to  Procedure(s): LEFT TOTAL HIP ARTHROPLASTY ANTERIOR APPROACH (Left) as a surgical intervention .  The patient's history has been reviewed, patient examined, no change in status, stable for surgery.  I have reviewed the patient's chart and labs.  Questions were answered to the patient's satisfaction.     Shelda PalLIN,Azaylea Maves D

## 2016-05-14 NOTE — Op Note (Signed)
NAME:  Julia Vargas                ACCOUNT NO.: [id number]      MEDICAL RECORD NO.: [id number]      [facility]      PHYSICIAN:  Durene RomansLIN,Khamiya Varin D  DATE OF BIRTH:  Mar 14, 1946     DATE OF PROCEDURE:  05/14/2016                                 OPERATIVE REPORT         PREOPERATIVE DIAGNOSIS: Left  hip osteoarthritis.      POSTOPERATIVE DIAGNOSIS:  Left hip osteoarthritis.      PROCEDURE:  Left total hip replacement through an anterior approach   utilizing Paxeon THR system, component size 52mm Logical cup, a size 36 lateralized liner, a size 1 HO Remedy stem with a 36 M delta ceramic   ball.      SURGEON:  Madlyn FrankelMatthew D. Charlann Boxerlin, M.D.      ASSISTANT:  Leilani AbleSteve Chabon, PA-C     ANESTHESIA:  Spinal.      SPECIMENS:  None.      COMPLICATIONS:  None.      BLOOD LOSS:  550 cc     DRAINS:  None.      INDICATION OF THE PROCEDURE:  Julia Vargas is a 70 y.o. female who had   presented to office for evaluation of left hip pain.  Radiographs revealed   progressive degenerative changes with bone-on-bone   articulation to the  hip joint.  The patient had painful limited range of   motion significantly affecting their overall quality of life.  The patient was failing to    respond to conservative measures, and at this point was ready   to proceed with more definitive measures.  The patient has noted progressive   degenerative changes in his hip, progressive problems and dysfunction   with regarding the hip prior to surgery.  Consent was obtained for   benefit of pain relief.  Specific risk of infection, DVT, component   failure, dislocation, need for revision surgery, as well discussion of   the anterior versus posterior approach were reviewed.  Consent was   obtained for benefit of anterior pain relief through an anterior   approach.      PROCEDURE IN DETAIL:  The patient was brought to operative theater.   Once adequate anesthesia, preoperative antibiotics, 2gm of  Ancef, 1 gm of Tranexamic Acid, and 10 mg of Decadron administered.   The patient was positioned supine on the OSI Hanna table.  Once adequate   padding of boney process was carried out, we had predraped out the hip, and  used fluoroscopy to confirm orientation of the pelvis and position.      The left hip was then prepped and draped from proximal iliac crest to   mid thigh with shower curtain technique.      Time-out was performed identifying the patient, planned procedure, and   extremity.     An incision was then made 2 cm distal and lateral to the   anterior superior iliac spine extending over the orientation of the   tensor fascia lata muscle and sharp dissection was carried down to the   fascia of the muscle and protractor placed in the soft tissues.      The fascia was then incised.  The muscle  belly was identified and swept   laterally and retractor placed along the superior neck.  Following   cauterization of the circumflex vessels and removing some pericapsular   fat, a second cobra retractor was placed on the inferior neck.  A third   retractor was placed on the anterior acetabulum after elevating the   anterior rectus.  A L-capsulotomy was along the line of the   superior neck to the trochanteric fossa, then extended proximally and   distally.  Tag sutures were placed and the retractors were then placed   intracapsular.  We then identified the trochanteric fossa and   orientation of my neck cut, confirmed this radiographically   and then made a neck osteotomy with the femur on traction.  The femoral   head was removed without difficulty or complication.  Traction was let   off and retractors were placed posterior and anterior around the   acetabulum.      The labrum and foveal tissue were debrided.  I began reaming with a 44mm   reamer and reamed up to 52mm reamer with good bony bed preparation and a 52mm   cup was chosen.  The final 52mm Logical cup was then impacted  under fluoroscopy  to confirm the depth of penetration and orientation with respect to   abduction.  A screw was placed followed by the hole eliminator.  The final   36 neutral lateralized liner was impacted with good visualized rim fit.  The cup was positioned anatomically within the acetabular portion of the pelvis.      At this point, the femur was rolled at 80 degrees.  Further capsule was   released off the inferior aspect of the femoral neck.  I then   released the superior capsule proximally.  The hook was placed laterally   along the femur and elevated manually and held in position with the bed   hook.  The leg was then extended and adducted with the leg rolled to 100   degrees of external rotation.  Once the proximal femur was fully   exposed, I used a box osteotome to set orientation.  I then began   broaching with the starting chili pepper broach and passed this by hand and then broached up to 1.  With size 1 broach in place I chose a high offset neck and did a trial reduction.  The offset was appropriate, leg lengths   appeared to be equal, confirmed radiographically.   Given these findings, I went ahead and dislocated the hip, repositioned all   retractors and positioned the right hip in the extended and abducted position.  The final 1 HO Remedy stem was   chosen and it was impacted down to the level of neck cut.  Based on this   and the trial reduction, a 36 M ceramic ball was chosen and   impacted onto a clean and dry trunnion, and the hip was reduced.  The   hip had been irrigated throughout the case again at this point.  I did   reapproximate the superior capsular leaflet to the anterior leaflet   using #1 Vicryl.  The fascia of the   tensor fascia lata muscle was then reapproximated using #1 Vicryl and #0 V-lock sutures.  The   remaining wound was closed with 2-0 Vicryl and running 4-0 Monocryl.   The hip was cleaned, dried, and dressed sterilely using Dermabond and    Aquacel dressing.  She was then  brought   to recovery room in stable condition tolerating the procedure well.    Leilani Able, PA-C was present for the entirety of the case involved from   preoperative positioning, perioperative retractor management, general   facilitation of the case, as well as primary wound closure as assistant.            Madlyn Frankel Charlann Boxer, M.D.        05/14/2016 1:02 PM

## 2016-05-15 DIAGNOSIS — Z6827 Body mass index (BMI) 27.0-27.9, adult: Secondary | ICD-10-CM | POA: Diagnosis not present

## 2016-05-15 DIAGNOSIS — E663 Overweight: Secondary | ICD-10-CM | POA: Diagnosis present

## 2016-05-15 DIAGNOSIS — M1612 Unilateral primary osteoarthritis, left hip: Secondary | ICD-10-CM | POA: Diagnosis not present

## 2016-05-15 LAB — BASIC METABOLIC PANEL
ANION GAP: 6 (ref 5–15)
BUN: 11 mg/dL (ref 6–20)
CALCIUM: 9.1 mg/dL (ref 8.9–10.3)
CO2: 25 mmol/L (ref 22–32)
CREATININE: 0.83 mg/dL (ref 0.44–1.00)
Chloride: 108 mmol/L (ref 101–111)
GFR calc Af Amer: >60 mL/min (ref >60)
GLUCOSE: 141 mg/dL — AB (ref 65–99)
Potassium: 3.9 mmol/L (ref 3.5–5.1)
Sodium: 139 mmol/L (ref 135–145)

## 2016-05-15 LAB — CBC
HCT: 33.1 % — ABNORMAL LOW (ref 36.0–46.0)
Hemoglobin: 11.1 g/dL — ABNORMAL LOW (ref 12.0–15.0)
MCH: 30 pg (ref 26.0–34.0)
MCHC: 33.5 g/dL (ref 30.0–36.0)
MCV: 89.5 fL (ref 78.0–100.0)
PLATELETS: 169 10*3/uL (ref 150–400)
RBC: 3.7 MIL/uL — ABNORMAL LOW (ref 3.87–5.11)
RDW: 13.1 % (ref 11.5–15.5)
WBC: 12.6 10*3/uL — AB (ref 4.0–10.5)

## 2016-05-15 MED ORDER — FERROUS SULFATE 325 (65 FE) MG PO TABS: 325.0000 mg | ORAL_TABLET | Freq: Three times a day (TID) | ORAL | 3 refills | Status: AC

## 2016-05-15 MED ORDER — TIZANIDINE HCL 4 MG PO TABS: 4.0000 mg | ORAL_TABLET | Freq: Four times a day (QID) | ORAL | 0 refills | Status: AC | PRN

## 2016-05-15 MED ORDER — DOCUSATE SODIUM 100 MG PO CAPS: 100.0000 mg | ORAL_CAPSULE | Freq: Two times a day (BID) | ORAL | 0 refills | Status: AC

## 2016-05-15 MED ORDER — POLYETHYLENE GLYCOL 3350 17 G PO PACK: 17.0000 g | PACK | Freq: Two times a day (BID) | ORAL | 0 refills | Status: AC

## 2016-05-15 MED ORDER — HYDROCODONE-ACETAMINOPHEN 7.5-325 MG PO TABS: 1.0000 | ORAL_TABLET | ORAL | 0 refills | Status: AC | PRN

## 2016-05-15 MED ORDER — INFLUENZA VAC SPLIT QUAD 0.5 ML IM SUSY
0.5000 mL | PREFILLED_SYRINGE | INTRAMUSCULAR | Status: AC
  Administered 2016-05-16: 0.5 mL via INTRAMUSCULAR
  Filled 2016-05-15: qty 0.5

## 2016-05-15 MED ORDER — ASPIRIN 81 MG PO CHEW: 81.0000 mg | CHEWABLE_TABLET | Freq: Two times a day (BID) | ORAL | 0 refills | Status: AC

## 2016-05-15 NOTE — Progress Notes (Signed)
   05/15/16 1200  PT Visit Information  Last PT Received On 05/15/16  Assistance Needed +1  History of Present Illness s/p L DA  THA  Subjective Data  Patient Stated Goal less pain  Restrictions  Weight Bearing Restrictions No  Other Position/Activity Restrictions WBAT  Pain Assessment  Pain Assessment 0-10  Pain Score 5  Pain Location L hip  Pain Descriptors / Indicators Grimacing;Guarding;Sore  Pain Intervention(s) Limited activity within patient's tolerance;Monitored during session;Premedicated before session  Cognition  Arousal/Alertness Awake/alert  Behavior During Therapy WFL for tasks assessed/performed  Overall Cognitive Status Within Functional Limits for tasks assessed  Bed Mobility  General bed mobility comments (NT)  Transfers  Overall transfer level Needs assistance  Equipment used Rolling walker (2 wheeled)  Sit to Stand Min assist  General transfer comment assist to rise and stabilize in standing; cues for hand placement and wt shift; has difficulty with functional hip flexion/anterior st shift  Ambulation/Gait  Ambulation/Gait assistance Min guard;Min assist  Ambulation Distance (Feet) 120 Feet  Assistive device Rolling walker (2 wheeled)  Gait Pattern/deviations Step-to pattern;Step-through pattern;Decreased stride length  General Gait Details assist for balance throughout, pt generally unsteady but balance improved with incr distance; cues for sequence and RW position  Balance  Sitting balance - Comments requires UE support or assist to maintain balance  Standing balance-Leahy Scale Poor  Total Joint Exercises  Ankle Circles/Pumps AROM;Both;10 reps  Heel Slides AROM;AAROM;Both;10 reps  Hip ABduction/ADduction AROM;AAROM;Both;10 reps  [employer]ad Sets AROM;Strengthening;Both;10 reps  [employer] Quad AROM;Left;10 reps  PT - End of Session  Equipment Utilized During Treatment Gait belt  Activity Tolerance Patient tolerated treatment well  Patient left in chair;with  call bell/phone within reach;with chair alarm set  PT - Assessment/Plan  PT Plan Current plan remains appropriate  PT Frequency (ACUTE ONLY) 7X/week  Follow Up Recommendations Home health PT;Supervision for mobility/OOB  PT equipment Rolling walker with 5" wheels;3in1 (PT)  PT Goal Progression  Progress towards PT goals Progressing toward goals  Acute Rehab PT Goals  PT Goal Formulation With patient  Time For Goal Achievement 05/20/16  Potential to Achieve Goals Good  PT Time Calculation  PT Start Time (ACUTE ONLY) 1140  PT Stop Time (ACUTE ONLY) 1159  PT Time Calculation (min) (ACUTE ONLY) 19 min  PT General Charges  $$ ACUTE PT VISIT 1 Procedure  PT Treatments  $Gait Training 8-22 mins

## 2016-05-15 NOTE — Evaluation (Signed)
Occupational Therapy Evaluation Patient Details Name: Dewain PenningOneata Gural MRN: [id number] DOB: Apr 12, 1946 Today's Date: 05/15/2016    History of Present Illness s/p L DA  THA   Clinical Impression   This 70 year old female was admitted for the above sx. She will have intermittent assistance at home from son and church friends.  Pt wants to be mod I with sponge bathing and donning pants/underwear as well as toileting as she will be alone part of day.  Goals are for supervision to min guard level in acute. Recommend continued HHOT for increased safety and independence at home    Follow Up Recommendations  Home health OT    Equipment Recommendations  3 in 1 bedside comode    Recommendations for Other Services       Precautions / Restrictions Precautions Precautions: Fall Restrictions Weight Bearing Restrictions: No Other Position/Activity Restrictions: WBAT      Mobility Bed Mobility Overal bed mobility: Needs Assistance           General bed mobility comments: oob (NT)  Transfers Overall transfer level: Needs assistance Equipment used: Rolling walker (2 wheeled) Transfers: Sit to/from Stand Sit to Stand: Min assist         General transfer comment: pt leans posteriorly when standing and initially had LOB.  Min A to rise and stabilize.  Cues for UEs    Balance Overall balance assessment: Needs assistance Sitting-balance support: Feet supported Sitting balance-Leahy Scale: Poor Sitting balance - Comments: requires UE support or assist to maintain balance     Standing balance-Leahy Scale: Poor Standing balance comment: reliant on UEs                             ADL Overall ADL's : Needs assistance/impaired     Grooming: Set up;Sitting       Lower Body Bathing: Minimal assistance;Sit to/from stand;With adaptive equipment       Lower Body Dressing: Minimal assistance;Sit to/from stand;With adaptive equipment                 General  ADL Comments: educated on and practiced with reacher. She will have son don her socks. She can perform UB adls with set up.  Walked forward and backwards a couple of steps as she was still connected to 02 and sats were low 90s.  Pt plans to sponge bathe at home     Vision     Perception     Praxis      Pertinent Vitals/Pain Pain Assessment: 0-10 Pain Score: 5  Pain Location: L hip Pain Descriptors / Indicators: Grimacing;Guarding;Sore Pain Intervention(s): Limited activity within patient's tolerance;Monitored during session;Premedicated before session     Hand Dominance     Extremity/Trunk Assessment Upper Extremity Assessment Upper Extremity Assessment: Overall WFL for tasks assessed          Communication Communication Communication: No difficulties   Cognition Arousal/Alertness: Awake/alert Behavior During Therapy: WFL for tasks assessed/performed Overall Cognitive Status: Within Functional Limits for tasks assessed                     General Comments       Exercises       Shoulder Instructions      Home Living Family/patient expects to be discharged to:: Private residence Living Arrangements: Children (son) Available Help at Discharge: Family;Available PRN/intermittently  Bathroom Shower/Tub: Therapist, sports characteristics: Teacher, early years/pre: None   Additional Comments: church friends will help intermittently      Prior Functioning/Environment Level of Independence: Independent                 OT Problem List: Decreased strength;Decreased activity tolerance;Impaired balance (sitting and/or standing);Decreased knowledge of use of DME or AE;Pain   OT Treatment/Interventions: Self-care/ADL training;DME and/or AE instruction;Balance training;Patient/family education    OT Goals(Current goals can be found in the care plan section) Acute Rehab OT Goals Patient Stated  Goal: regain independence OT Goal Formulation: With patient Time For Goal Achievement: 05/22/16 Potential to Achieve Goals: Good ADL Goals Pt Will Perform Grooming: with supervision;standing Pt Will Perform Lower Body Bathing: with min guard assist;sit to/from stand;with adaptive equipment Pt Will Perform Lower Body Dressing: with min guard assist;with adaptive equipment;sit to/from stand Pt Will Transfer to Toilet: ambulating;bedside commode Pt Will Perform Toileting - Clothing Manipulation and hygiene: with min guard assist;sit to/from stand  OT Frequency: Min 2X/week   Barriers to D/C:            Co-evaluation              End of Session    Activity Tolerance: Patient tolerated treatment well Patient left: in chair;with call bell/phone within reach;with chair alarm set   Time: 1610-9604 OT Time Calculation (min): 26 min Charges:  OT General Charges $OT Visit: 1 Procedure OT Evaluation $OT Eval Low Complexity: 1 Procedure OT Treatments $Self Care/Home Management : 8-22 mins G-Codes:    Irineo Gaulin 2016-05-20, 1:02 PM Marica Otter, OTR/L [phone] 2016/05/20

## 2016-05-15 NOTE — Evaluation (Signed)
Physical Therapy Evaluation Patient Details Name: Julia Vargas MRN: [id number] DOB: 04/21/1946 Today's Date: 05/15/2016   History of Present Illness  s/p L DA  THA  Clinical Impression  Pt is s/p THA resulting in the deficits listed below (see PT Problem List). * Pt will benefit from skilled PT to increase their independence and safety with mobility to allow discharge to the venue listed below.  Plan is for HHPT per MD     Follow Up Recommendations Home health PT;Supervision for mobility/OOB    Equipment Recommendations  Rolling walker with 5" wheels;3in1 (PT)    Recommendations for Other Services       Precautions / Restrictions Precautions Precautions: Fall Restrictions Weight Bearing Restrictions: No Other Position/Activity Restrictions: WBAT      Mobility  Bed Mobility Overal bed mobility: Needs Assistance Bed Mobility: Supine to Sit     Supine to sit: Min assist;Mod assist     General bed mobility comments: incr time, assist with trunk and LLE  Transfers Overall transfer level: Needs assistance Equipment used: Rolling walker (2 wheeled) Transfers: Sit to/from Stand Sit to Stand: Min assist;From elevated surface         General transfer comment: assist to rise and stabilize in standing; cues for hand placement and wt shift  Ambulation/Gait Ambulation/Gait assistance: Min assist Ambulation Distance (Feet): 70 Feet Assistive device: Rolling walker (2 wheeled) Gait Pattern/deviations: Step-to pattern;Wide base of support;Decreased step length - right;Decreased step length - left;Decreased weight shift to left     General Gait Details: assist for balance throughout, pt generally unsteady but balance improved with incr distance; cues for sequence and RW position  Stairs            Wheelchair Mobility    Modified Rankin (Stroke Patients Only)       Balance Overall balance assessment: Needs assistance Sitting-balance support: Feet  supported Sitting balance-Leahy Scale: Poor Sitting balance - Comments: requires UE support or assist to maintain balance     Standing balance-Leahy Scale: Poor Standing balance comment: reliant on UEs                              Pertinent Vitals/Pain Pain Assessment: 0-10 Pain Score: 2  Pain Location: L hip  Pain Descriptors / Indicators: Sore Pain Intervention(s): Limited activity within patient's tolerance;Monitored during session;Premedicated before session    Home Living Family/patient expects to be discharged to:: Private residence Living Arrangements: Children (lives with son, works days) Available Help at Discharge: Family;Available PRN/intermittently Type of Home: Apartment Home Access: Stairs to enter   Entrance Stairs-Number of Steps: 17 Home Layout: One level Home Equipment: None      Prior Function Level of Independence: Independent               Hand Dominance        Extremity/Trunk Assessment   Upper Extremity Assessment: Defer to OT evaluation;Overall WFL for tasks assessed           Lower Extremity Assessment: LLE deficits/detail   LLE Deficits / Details: ankle WFL; hip flexion to ~70* knee flexion/extension grossly WFL     Communication   Communication: No difficulties  Cognition Arousal/Alertness: Awake/alert Behavior During Therapy: WFL for tasks assessed/performed Overall Cognitive Status: Within Functional Limits for tasks assessed                      General Comments      Exercises  Assessment/Plan    PT Assessment Patient needs continued PT services  PT Problem List Decreased strength;Decreased activity tolerance;Decreased mobility;Decreased range of motion;Decreased balance;Decreased knowledge of use of DME          PT Treatment Interventions DME instruction;Gait training;Functional mobility training;Stair training;Therapeutic activities;Therapeutic exercise;Patient/family education    PT  Goals (Current goals can be found in the Care Plan section)  Acute Rehab PT Goals Patient Stated Goal: less pain PT Goal Formulation: With patient Time For Goal Achievement: 05/20/16 Potential to Achieve Goals: Good    Frequency 7X/week   Barriers to discharge        Co-evaluation               End of Session Equipment Utilized During Treatment: Gait belt Activity Tolerance: Patient tolerated treatment well Patient left: in bed;with call bell/phone within reach           Time: 0852-0920 PT Time Calculation (min) (ACUTE ONLY): 28 min   Charges:   PT Evaluation $PT Eval Low Complexity: 1 Procedure PT Treatments $Gait Training: 8-22 mins   PT G Codes:        Maile Linford 05/15/2016, 9:53 AM

## 2016-05-15 NOTE — Progress Notes (Signed)
     Subjective: 1 Day Post-Op Procedure(s) (LRB): LEFT TOTAL HIP ARTHROPLASTY ANTERIOR APPROACH (Left)   Patient reports pain as mild, pain controlled. No events throughout the night.   Objective:   VITALS:   Vitals:   05/15/16 0210 05/15/16 0615  BP: 119/68 110/63  Pulse: 64 62  Resp: 16 16  Temp: 98.2 F (36.8 C) 98 F (36.7 C)    Dorsiflexion/Plantar flexion intact Incision: dressing C/D/I No cellulitis present Compartment soft  LABS  Recent Labs  05/15/16 0449  HGB 11.1*  HCT 33.1*  WBC 12.6*  PLT 169     Recent Labs  05/15/16 0449  NA 139  K 3.9  BUN 11  CREATININE 0.83  GLUCOSE 141*     Assessment/Plan: 1 Day Post-Op Procedure(s) (LRB): LEFT TOTAL HIP ARTHROPLASTY ANTERIOR APPROACH (Left) Foley cath d/c'ed Advance diet Up with therapy D/C IV fluids Discharge home with home health, eventually when ready  Overweight (BMI 25-29.9) Estimated body mass index is 27.46 kg/m as calculated from the following:   Height as of this encounter: 5\' 3"  (1.6 m).   Weight as of this encounter: 70.3 kg (155 lb). Patient also counseled that weight may inhibit the healing process Patient counseled that losing weight will help with future health issues        Anastasio AuerbachMatthew S. Jaley Yan   PAC  05/15/2016, 9:02 AM

## 2016-05-15 NOTE — Discharge Instructions (Signed)

## 2016-05-16 DIAGNOSIS — R269 Unspecified abnormalities of gait and mobility: Secondary | ICD-10-CM | POA: Diagnosis not present

## 2016-05-16 DIAGNOSIS — Z23 Encounter for immunization: Secondary | ICD-10-CM | POA: Diagnosis not present

## 2016-05-16 LAB — BASIC METABOLIC PANEL
ANION GAP: 4 — AB (ref 5–15)
BUN: 19 mg/dL (ref 6–20)
CHLORIDE: 111 mmol/L (ref 101–111)
CO2: 25 mmol/L (ref 22–32)
Calcium: 8.9 mg/dL (ref 8.9–10.3)
Creatinine, Ser: 1.19 mg/dL — ABNORMAL HIGH (ref 0.44–1.00)
GFR calc non Af Amer: 45 mL/min — ABNORMAL LOW (ref >60)
GFR, EST AFRICAN AMERICAN: 52 mL/min — AB (ref >60)
Glucose, Bld: 104 mg/dL — ABNORMAL HIGH (ref 65–99)
POTASSIUM: 3.7 mmol/L (ref 3.5–5.1)
SODIUM: 140 mmol/L (ref 135–145)

## 2016-05-16 LAB — CBC
HCT: 32.3 % — ABNORMAL LOW (ref 36.0–46.0)
HEMOGLOBIN: 10.7 g/dL — AB (ref 12.0–15.0)
MCH: 30.9 pg (ref 26.0–34.0)
MCHC: 33.1 g/dL (ref 30.0–36.0)
MCV: 93.4 fL (ref 78.0–100.0)
Platelets: 173 10*3/uL (ref 150–400)
RBC: 3.46 MIL/uL — AB (ref 3.87–5.11)
RDW: 13.8 % (ref 11.5–15.5)
WBC: 16.2 10*3/uL — ABNORMAL HIGH (ref 4.0–10.5)

## 2016-05-16 NOTE — Progress Notes (Signed)
Physical Therapy Treatment Patient Details Name: Julia PenningOneata Mondor MRN: [id number] DOB: 1945/11/05 Today's Date: 05/16/2016    History of Present Illness s/p L DA  THA    PT Comments    POD # 2 Assisted with amb in hallway, practiced stairs then performed all THR TE's following HEP.  Instructed on proper tech and freq as well as use of ICE.  Pt ready for D/C to home.   Follow Up Recommendations  Home health PT;Supervision for mobility/OOB     Equipment Recommendations  Rolling walker with 5" wheels;3in1 (PT)    Recommendations for Other Services       Precautions / Restrictions Precautions Precautions: Fall Restrictions Weight Bearing Restrictions: No Other Position/Activity Restrictions: WBAT    Mobility  Bed Mobility               General bed mobility comments: oob in recliner  Transfers Overall transfer level: Needs assistance Equipment used: Rolling walker (2 wheeled) Transfers: Sit to/from BJ'sStand;Stand Pivot Transfers Sit to Stand: Supervision;Min guard Stand pivot transfers: Supervision;Min guard       General transfer comment: 50% VC's on proper hand placement and safety with turn completion  Ambulation/Gait Ambulation/Gait assistance: Supervision;Min guard Ambulation Distance (Feet): 85 Feet Assistive device: Rolling walker (2 wheeled) Gait Pattern/deviations: Step-to pattern;Step-through pattern Gait velocity: decreased   General Gait Details: 25% Vc's on proper walker to self placement   Stairs Stairs: Yes Stairs assistance: Max assist Stair Management: No rails;Step to pattern;Forwards Number of Stairs: 4 General stair comments: B side by side person assist up forward as pt plans to have 2 young, strong men assist her to her second floor apt.    Wheelchair Mobility    Modified Rankin (Stroke Patients Only)       Balance                                    Cognition Arousal/Alertness: Awake/alert Behavior During  Therapy: WFL for tasks assessed/performed (required repeat VC's) Overall Cognitive Status: Within Functional Limits for tasks assessed                      Exercises   Total Hip Replacement TE's 10 reps ankle pumps 10 reps knee presses 10 reps heel slides 10 reps SAQ's 10 reps ABD Followed by ICE     General Comments        Pertinent Vitals/Pain Pain Assessment: 0-10 Pain Score: 3  Pain Location: L hip Pain Descriptors / Indicators: Discomfort;Tender Pain Intervention(s): Monitored during session;Repositioned;Ice applied    Home Living                      Prior Function            PT Goals (current goals can now be found in the care plan section) Progress towards PT goals: Progressing toward goals    Frequency    7X/week      PT Plan Current plan remains appropriate    Co-evaluation             End of Session Equipment Utilized During Treatment: Gait belt Activity Tolerance: Patient tolerated treatment well Patient left: in chair;with call bell/phone within reach;with chair alarm set     Time: [id number] PT Time Calculation (min) (ACUTE ONLY): 32 min  Charges:  $Gait Training: 8-22 mins $Therapeutic Exercise: 8-22 mins  G Codes:      Rica Koyanagi  PTA [facility]  Acute  Rehab Pager      [phone]

## 2016-05-16 NOTE — Progress Notes (Signed)
Discharged from floor via w/c for transport home by car. Friend and belongings with pt. No changes in assessment. [place]ss [place], [employer]

## 2016-05-16 NOTE — Progress Notes (Signed)
     Subjective: 2 Days Post-Op Procedure(s) (LRB): LEFT TOTAL HIP ARTHROPLASTY ANTERIOR APPROACH (Left)   Patient reports pain as mild, pain controlled. No events throughout the night.  Feels much better today.  Ready to be discharged home.  Objective:   VITALS:   Vitals:   05/15/16 2130 05/16/16 0511  BP: 129/73 127/79  Pulse: 68 68  Resp: 16 16  Temp: 98.5 F (36.9 C) 98 F (36.7 C)    Dorsiflexion/Plantar flexion intact Incision: dressing C/D/I No cellulitis present Compartment soft  LABS  Recent Labs  05/15/16 0449 05/16/16 0450  HGB 11.1* 10.7*  HCT 33.1* 32.3*  WBC 12.6* 16.2*  PLT 169 173     Recent Labs  05/15/16 0449 05/16/16 0450  NA 139 140  K 3.9 3.7  BUN 11 19  CREATININE 0.83 1.19*  GLUCOSE 141* 104*     Assessment/Plan: 2 Days Post-Op Procedure(s) (LRB): LEFT TOTAL HIP ARTHROPLASTY ANTERIOR APPROACH (Left) Up with therapy Discharge home with home health  [facility]. Follow up with OLIN,Kiaya Haliburton D in 2 weeks.  Contact information:  [facility] [address], [address] [place] [id number] [phone]        Anastasio AuerbachMatthew S. Delmi Fulfer   PAC  05/16/2016, 8:00 AM

## 2016-05-17 DIAGNOSIS — Z96642 Presence of left artificial hip joint: Secondary | ICD-10-CM | POA: Diagnosis not present

## 2016-05-17 DIAGNOSIS — Z79891 Long term (current) use of opiate analgesic: Secondary | ICD-10-CM | POA: Diagnosis not present

## 2016-05-17 DIAGNOSIS — Z7952 Long term (current) use of systemic steroids: Secondary | ICD-10-CM | POA: Diagnosis not present

## 2016-05-17 DIAGNOSIS — Z7982 Long term (current) use of aspirin: Secondary | ICD-10-CM | POA: Diagnosis not present

## 2016-05-17 DIAGNOSIS — Z471 Aftercare following joint replacement surgery: Secondary | ICD-10-CM | POA: Diagnosis not present

## 2016-05-17 NOTE — Discharge Summary (Signed)
Physician Discharge Summary  Patient ID: Julia Vargas MRN: [id number] DOB/AGE: 04-29-1946 70 y.o.  Admit date: 05/14/2016 Discharge date: 05/16/2016   Procedures:  Procedure(s) (LRB): LEFT TOTAL HIP ARTHROPLASTY ANTERIOR APPROACH (Left)  Attending Physician:  Dr. Durene RomansMatthew Olin   Admission Diagnoses:   Left hip primary OA / pain  Discharge Diagnoses:  Principal Problem:   S/P left THA, AA Active Problems:   Overweight (BMI 25.0-29.9)  Past Medical History:  Diagnosis Date  . Arthritis     HPI:    Julia Vargas, 70 y.o. female, has a history of pain and functional disability in the left hip(s) due to arthritis and patient has failed non-surgical conservative treatments for greater than 12 weeks to include NSAID's and/or analgesics, use of assistive devices and activity modification.  Onset of symptoms was gradual starting 40+ years ago with gradually worsening course since that time.The patient noted no past surgery on the left hip(s).  Patient currently rates pain in the left hip at 9 out of 10 with activity. Patient has night pain, worsening of pain with activity and weight bearing, trendelenberg gait, pain that interfers with activities of daily living and pain with passive range of motion. Patient has evidence of periarticular osteophytes and joint space narrowing by imaging studies. This condition presents safety issues increasing the risk of falls.  There is no current active infection.  Risks, benefits and expectations were discussed with the patient.  Risks including but not limited to the risk of anesthesia, blood clots, nerve damage, blood vessel damage, failure of the prosthesis, infection and up to and including death.  Patient understand the risks, benefits and expectations and wishes to proceed with surgery.  PCP: No PCP Per Patient   Discharged Condition: good  Hospital Course:  Patient underwent the above stated procedure on 05/14/2016. Patient tolerated the procedure well  and brought to the recovery room in good condition and subsequently to the floor.  POD #1 BP: 110/63 ; Pulse: 62 ; Temp: 98 F (36.7 C) ; Resp: 16 Patient reports pain as mild, pain controlled. No events throughout the night. Dorsiflexion/plantar flexion intact, incision: dressing C/D/I, no cellulitis present and compartment soft.   LABS  Basename    HGB     11.1  HCT     33.1   POD #2  BP: 127/79 ; Pulse: 68 ; Temp: 98 F (36.7 C) ; Resp: 16 Patient reports pain as mild, pain controlled. No events throughout the night.  Feels much better today.  Ready to be discharged home. Dorsiflexion/plantar flexion intact, incision: dressing C/D/I, no cellulitis present and compartment soft.   LABS  Basename    HGB     10.7  HCT     32.3    Discharge Exam: General appearance: alert, cooperative and no distress Extremities: Homans sign is negative, no sign of DVT, no edema, redness or tenderness in the calves or thighs and no ulcers, gangrene or trophic changes  Disposition: Home with follow up in 2 weeks   Follow-up Information    Shelda PalLIN,Marinna Blane D, MD. Schedule an appointment as soon as possible for a visit in 2 week(s).   Specialty:  Orthopedic Surgery Contact information: [address] [place] [id number] [phone]           Discharge Instructions    Call MD / Call 911    Complete by:  As directed    If you experience chest pain or shortness of breath, CALL 911 and be  transported to the hospital emergency room.  If you develope a fever above 101 F, pus (white drainage) or increased drainage or redness at the wound, or calf pain, call your surgeon's office.   Change dressing    Complete by:  As directed    Maintain surgical dressing until follow up in the clinic. If the edges start to pull up, may reinforce with tape. If the dressing is no longer working, may remove and cover with gauze and tape, but must keep the area dry and clean.  Call with any questions  or concerns.   Constipation Prevention    Complete by:  As directed    Drink plenty of fluids.  Prune juice may be helpful.  You may use a stool softener, such as Colace (over the counter) 100 mg twice a day.  Use MiraLax (over the counter) for constipation as needed.   Diet - low sodium heart healthy    Complete by:  As directed    Discharge instructions    Complete by:  As directed    Maintain surgical dressing until follow up in the clinic. If the edges start to pull up, may reinforce with tape. If the dressing is no longer working, may remove and cover with gauze and tape, but must keep the area dry and clean.  Follow up in 2 weeks at [facility]. Call with any questions or concerns.   Increase activity slowly as tolerated    Complete by:  As directed    Weight bearing as tolerated with assist device (walker, cane, etc) as directed, use it as long as suggested by your surgeon or therapist, typically at least 4-6 weeks.   TED hose    Complete by:  As directed    Use stockings (TED hose) for 2 weeks on both leg(s).  You may remove them at night for sleeping.        Medication List    STOP taking these medications   acetaminophen 500 MG tablet Commonly known as:  TYLENOL     TAKE these medications   aspirin 81 MG chewable tablet Chew 1 tablet (81 mg total) by mouth 2 (two) times daily. Take for 4 weeks.   docusate sodium 100 MG capsule Commonly known as:  COLACE Take 1 capsule (100 mg total) by mouth 2 (two) times daily.   ferrous sulfate 325 (65 FE) MG tablet Take 1 tablet (325 mg total) by mouth 3 (three) times daily after meals. What changed:  medication strength  how much to take  when to take this   Fish Oil 1000 MG Caps Take 1,000 mg by mouth daily.   glucosamine-chondroitin 500-400 MG tablet Take 1 tablet by mouth 3 (three) times daily.   Glucosamine-Chondroitin Tabs Take 1 tablet by mouth daily.   HYDROcodone-acetaminophen 7.5-325 MG  tablet Commonly known as:  NORCO Take 1-2 tablets by mouth every 4 (four) hours as needed for moderate pain.   LAXATIVE PO Take 2 tablets by mouth at bedtime.   polyethylene glycol packet Commonly known as:  MIRALAX / GLYCOLAX Take 17 g by mouth 2 (two) times daily.   predniSONE 20 MG tablet Commonly known as:  DELTASONE Take 2 tablets daily with breakfast.   tiZANidine 4 MG tablet Commonly known as:  ZANAFLEX Take 1 tablet (4 mg total) by mouth every 6 (six) hours as needed for muscle spasms.        Signed: Anastasio Auerbach. Marti Mclane   PA-C  05/17/2016, 11:19 PM

## 2016-05-20 DIAGNOSIS — Z7952 Long term (current) use of systemic steroids: Secondary | ICD-10-CM | POA: Diagnosis not present

## 2016-05-20 DIAGNOSIS — Z7982 Long term (current) use of aspirin: Secondary | ICD-10-CM | POA: Diagnosis not present

## 2016-05-20 DIAGNOSIS — Z471 Aftercare following joint replacement surgery: Secondary | ICD-10-CM | POA: Diagnosis not present

## 2016-05-20 DIAGNOSIS — Z96642 Presence of left artificial hip joint: Secondary | ICD-10-CM | POA: Diagnosis not present

## 2016-05-20 DIAGNOSIS — Z79891 Long term (current) use of opiate analgesic: Secondary | ICD-10-CM | POA: Diagnosis not present

## 2016-05-27 DIAGNOSIS — Z79891 Long term (current) use of opiate analgesic: Secondary | ICD-10-CM | POA: Diagnosis not present

## 2016-05-27 DIAGNOSIS — Z96642 Presence of left artificial hip joint: Secondary | ICD-10-CM | POA: Diagnosis not present

## 2016-05-27 DIAGNOSIS — Z7952 Long term (current) use of systemic steroids: Secondary | ICD-10-CM | POA: Diagnosis not present

## 2016-05-27 DIAGNOSIS — Z471 Aftercare following joint replacement surgery: Secondary | ICD-10-CM | POA: Diagnosis not present

## 2016-05-27 DIAGNOSIS — Z7982 Long term (current) use of aspirin: Secondary | ICD-10-CM | POA: Diagnosis not present

## 2016-06-03 DIAGNOSIS — Z471 Aftercare following joint replacement surgery: Secondary | ICD-10-CM | POA: Diagnosis not present

## 2016-06-03 DIAGNOSIS — Z7952 Long term (current) use of systemic steroids: Secondary | ICD-10-CM | POA: Diagnosis not present

## 2016-06-03 DIAGNOSIS — Z79891 Long term (current) use of opiate analgesic: Secondary | ICD-10-CM | POA: Diagnosis not present

## 2016-06-03 DIAGNOSIS — Z7982 Long term (current) use of aspirin: Secondary | ICD-10-CM | POA: Diagnosis not present

## 2016-06-03 DIAGNOSIS — Z96642 Presence of left artificial hip joint: Secondary | ICD-10-CM | POA: Diagnosis not present

## 2016-06-10 DIAGNOSIS — Z96642 Presence of left artificial hip joint: Secondary | ICD-10-CM | POA: Diagnosis not present

## 2016-06-10 DIAGNOSIS — Z471 Aftercare following joint replacement surgery: Secondary | ICD-10-CM | POA: Diagnosis not present

## 2016-06-10 DIAGNOSIS — Z7952 Long term (current) use of systemic steroids: Secondary | ICD-10-CM | POA: Diagnosis not present

## 2016-06-10 DIAGNOSIS — Z7982 Long term (current) use of aspirin: Secondary | ICD-10-CM | POA: Diagnosis not present

## 2016-06-10 DIAGNOSIS — Z79891 Long term (current) use of opiate analgesic: Secondary | ICD-10-CM | POA: Diagnosis not present

## 2016-06-20 DIAGNOSIS — N813 Complete uterovaginal prolapse: Secondary | ICD-10-CM | POA: Diagnosis not present

## 2016-06-27 DIAGNOSIS — Z96642 Presence of left artificial hip joint: Secondary | ICD-10-CM | POA: Diagnosis not present

## 2016-07-11 DIAGNOSIS — N814 Uterovaginal prolapse, unspecified: Secondary | ICD-10-CM | POA: Diagnosis not present

## 2016-10-21 DIAGNOSIS — N813 Complete uterovaginal prolapse: Secondary | ICD-10-CM | POA: Diagnosis not present

## 2016-11-20 ENCOUNTER — Telehealth: Payer: Self-pay | Admitting: General Practice

## 2016-11-20 NOTE — Telephone Encounter (Signed)
I left a message asking the patient to call and confirm her PCP. [place] (PSC)

## 2017-01-27 DIAGNOSIS — N813 Complete uterovaginal prolapse: Secondary | ICD-10-CM | POA: Diagnosis not present

## 2017-01-29 DIAGNOSIS — N813 Complete uterovaginal prolapse: Secondary | ICD-10-CM | POA: Diagnosis not present

## 2017-04-07 DIAGNOSIS — N813 Complete uterovaginal prolapse: Secondary | ICD-10-CM | POA: Diagnosis not present

## 2017-05-19 DIAGNOSIS — N813 Complete uterovaginal prolapse: Secondary | ICD-10-CM | POA: Diagnosis not present

## 2017-05-27 DIAGNOSIS — Z13220 Encounter for screening for lipoid disorders: Secondary | ICD-10-CM | POA: Diagnosis not present

## 2017-05-27 DIAGNOSIS — Z0000 Encounter for general adult medical examination without abnormal findings: Secondary | ICD-10-CM | POA: Diagnosis not present

## 2017-05-27 DIAGNOSIS — Z131 Encounter for screening for diabetes mellitus: Secondary | ICD-10-CM | POA: Diagnosis not present

## 2017-05-27 DIAGNOSIS — N813 Complete uterovaginal prolapse: Secondary | ICD-10-CM | POA: Diagnosis not present

## 2017-05-27 DIAGNOSIS — Z1389 Encounter for screening for other disorder: Secondary | ICD-10-CM | POA: Diagnosis not present

## 2017-05-27 DIAGNOSIS — N952 Postmenopausal atrophic vaginitis: Secondary | ICD-10-CM | POA: Diagnosis not present

## 2017-05-27 DIAGNOSIS — Z130 Encounter for screening for diseases of the blood and blood-forming organs and certain disorders involving the immune mechanism: Secondary | ICD-10-CM | POA: Diagnosis not present

## 2017-06-30 ENCOUNTER — Telehealth: Payer: Self-pay | Admitting: General Practice

## 2017-06-30 NOTE — Telephone Encounter (Signed)
Left msg asking pt to confirm PCP or check with her insurance to ensure we are not listed as PCP. VDM (DD)

## 2017-07-01 DIAGNOSIS — R21 Rash and other nonspecific skin eruption: Secondary | ICD-10-CM | POA: Diagnosis not present

## 2017-07-15 DIAGNOSIS — N814 Uterovaginal prolapse, unspecified: Secondary | ICD-10-CM | POA: Diagnosis not present

## 2017-08-19 DIAGNOSIS — N813 Complete uterovaginal prolapse: Secondary | ICD-10-CM | POA: Diagnosis not present

## 2017-08-19 DIAGNOSIS — R32 Unspecified urinary incontinence: Secondary | ICD-10-CM | POA: Diagnosis not present

## 2017-08-27 DIAGNOSIS — N3946 Mixed incontinence: Secondary | ICD-10-CM | POA: Diagnosis not present

## 2017-08-27 DIAGNOSIS — R350 Frequency of micturition: Secondary | ICD-10-CM | POA: Diagnosis not present

## 2017-08-27 DIAGNOSIS — R351 Nocturia: Secondary | ICD-10-CM | POA: Diagnosis not present

## 2017-10-07 DIAGNOSIS — N814 Uterovaginal prolapse, unspecified: Secondary | ICD-10-CM | POA: Diagnosis not present

## 2017-10-07 DIAGNOSIS — Z01818 Encounter for other preprocedural examination: Secondary | ICD-10-CM | POA: Diagnosis not present

## 2017-12-16 IMAGING — DX DG HIP (WITH OR WITHOUT PELVIS) 1V PORT*L*
2 series · 2 of 2 positions shown · non-contrast
Comparison: None

CLINICAL DATA: Left hip arthroplasty

EXAM:
DG HIP (WITH OR WITHOUT PELVIS) 1V PORT LEFT; DG C-ARM 61-120 MIN-NO
REPORT

[pelvis ap]
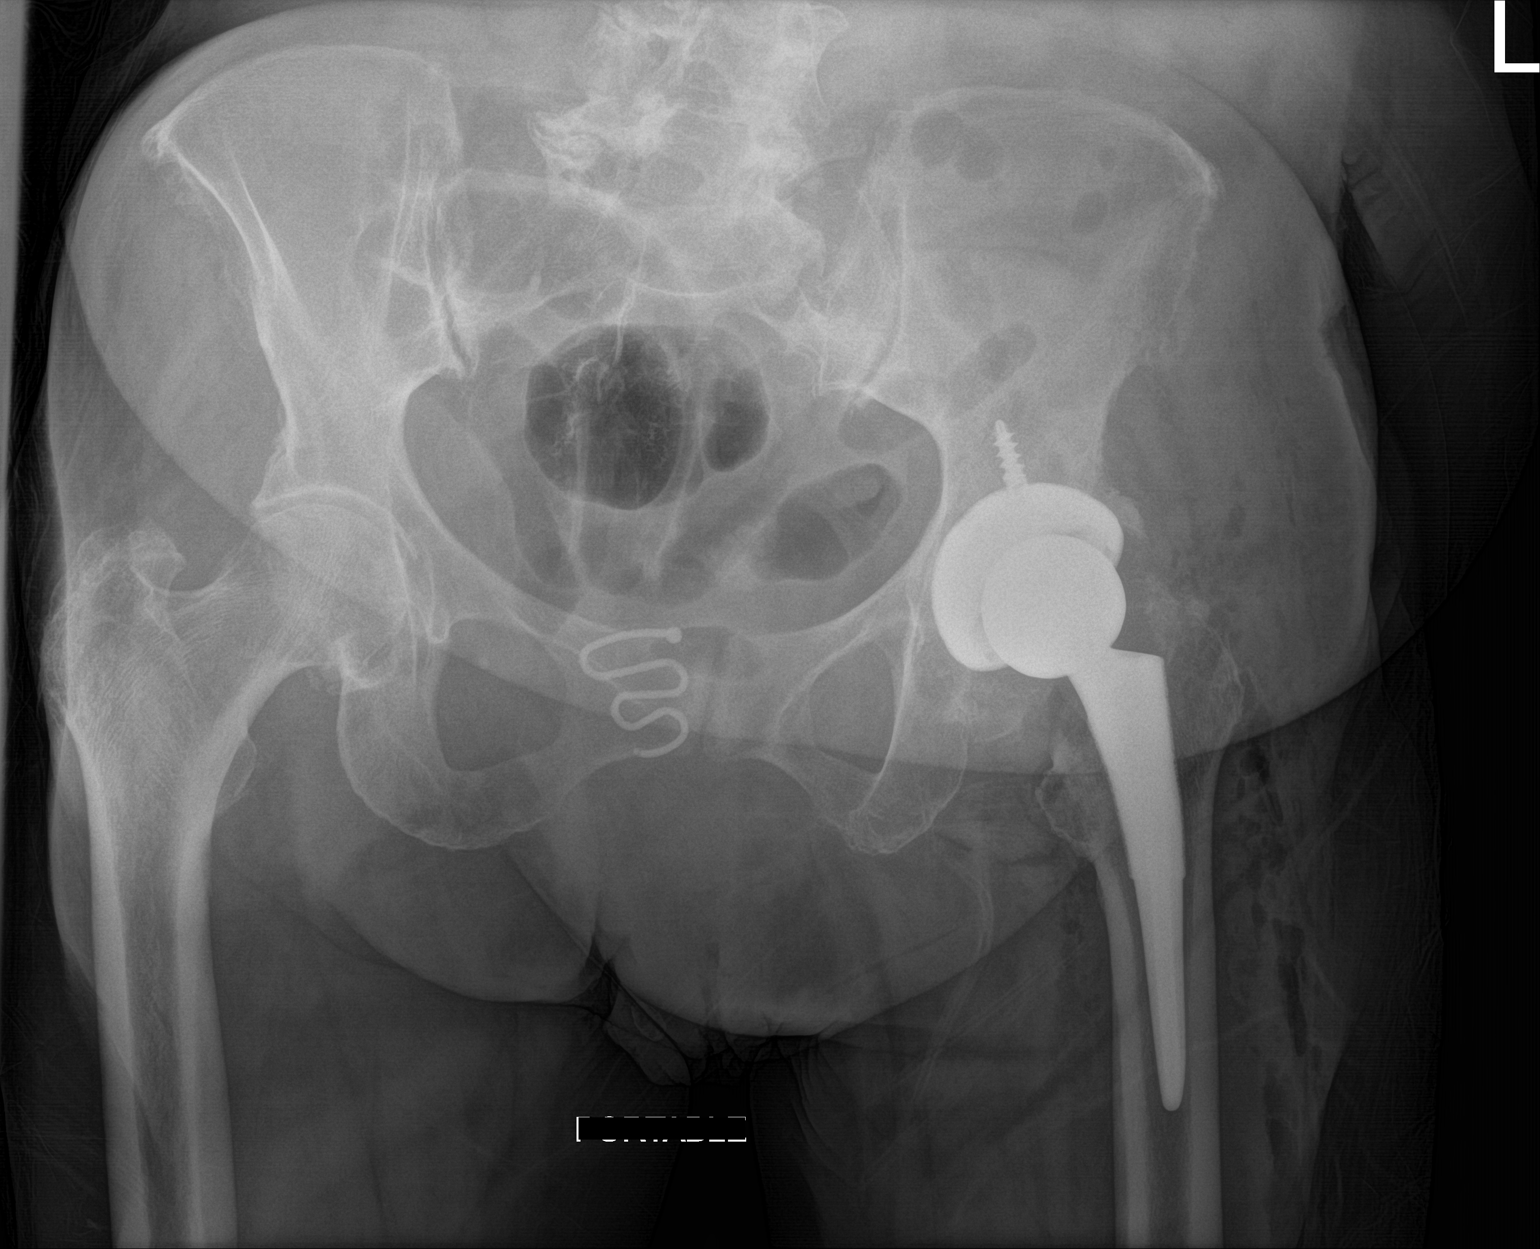

[hip lat]
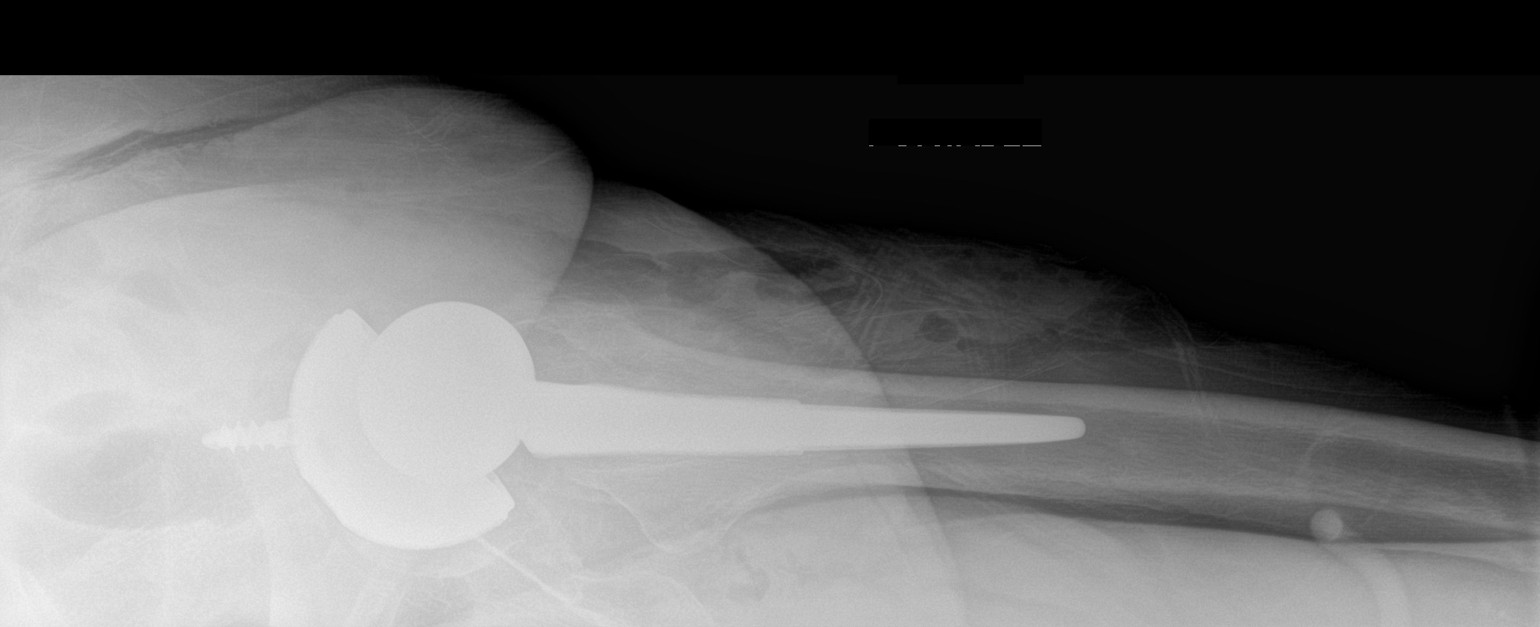

[2 of 2 positions shown; findings below may reference images not displayed]

FINDINGS: There is an uncemented left total hip arthroplasty with adjacent
subcutaneous and intra-articular emphysema. No hardware failure nor
malalignment. There is slight joint space narrowing of the native
right hip with minimal spurring inferiorly off the acetabulum. There
is lower lumbar degenerative disc disease and facet arthropathy of
the visualized lumbar spine from L4 caudad to S1. Sclerosis about
both SI joints. Intrauterine device is also seen projecting over the
lower pelvis.
IMPRESSION: Postop left total hip arthroplasty in near anatomic alignment
without postoperative fracture nor hardware failure.

Lower lumbar degenerative disc disease and facet arthropathy of the
visualized lumbar spine from L4 caudad to S1.

SI joint osteoarthritis.

Minimal osteoarthritic spurring about the right native hip.

## 2017-12-23 DIAGNOSIS — N814 Uterovaginal prolapse, unspecified: Secondary | ICD-10-CM | POA: Diagnosis not present

## 2018-01-14 DIAGNOSIS — N813 Complete uterovaginal prolapse: Secondary | ICD-10-CM | POA: Diagnosis not present

## 2018-01-14 DIAGNOSIS — N765 Ulceration of vagina: Secondary | ICD-10-CM | POA: Diagnosis not present

## 2018-01-21 DIAGNOSIS — N3946 Mixed incontinence: Secondary | ICD-10-CM | POA: Diagnosis not present

## 2018-01-21 DIAGNOSIS — N813 Complete uterovaginal prolapse: Secondary | ICD-10-CM | POA: Diagnosis not present

## 2018-01-30 ENCOUNTER — Telehealth: Payer: Self-pay | Admitting: General Practice

## 2018-01-30 NOTE — Telephone Encounter (Signed)
I spoke with the patient who stated that she recently spoke with someone about getting established with a PCP, but she doesn't know their name. VDM (DD)

## 2018-02-09 DIAGNOSIS — N8189 Other female genital prolapse: Secondary | ICD-10-CM | POA: Diagnosis not present

## 2018-02-09 DIAGNOSIS — Z96642 Presence of left artificial hip joint: Secondary | ICD-10-CM | POA: Diagnosis not present

## 2018-02-09 DIAGNOSIS — N813 Complete uterovaginal prolapse: Secondary | ICD-10-CM | POA: Diagnosis not present

## 2018-02-09 DIAGNOSIS — N3281 Overactive bladder: Secondary | ICD-10-CM | POA: Diagnosis not present

## 2018-02-13 DIAGNOSIS — Z466 Encounter for fitting and adjustment of urinary device: Secondary | ICD-10-CM | POA: Diagnosis not present

## 2018-02-13 DIAGNOSIS — Z8742 Personal history of other diseases of the female genital tract: Secondary | ICD-10-CM | POA: Diagnosis not present

## 2018-02-17 ENCOUNTER — Other Ambulatory Visit: Payer: Self-pay

## 2018-02-17 NOTE — Patient Outreach (Signed)
[facility]) Care Management  02/17/2018  Julia PenningOneata Vargas August 16, 1945 [id number]   Referral Date: 02/17/18 Referral Source: HTA report Date of Admission: 02/09/18 Diagnosis: Uterine Prolapse Date of Discharge:02/10/18 [facility] Insurance:  HTA  Outreach attempt # 1 No answer.  HIPAA compliant voice message left.    Plan: RN CM will send letter and attempt within 4 business days.   Julia Vargas Julia Banks, RN, MSN [facility] Care Management Care Management Coordinator Direct Line [phone] Toll Free: [phone]  Fax: [phone]

## 2018-02-18 ENCOUNTER — Other Ambulatory Visit: Payer: Self-pay

## 2018-02-18 NOTE — Patient Outreach (Signed)
[facility] Care Management  02/18/2018  Dewain PenningOneata Ector 08-27-1945 [id number]   Referral Date: 02/17/18 Referral Source: HTA report Date of Admission: 02/09/18 Diagnosis: Uterine Prolapse Date of Discharge:02/10/18 [facility] Insurance:  HTA    Outreach attempt # 2 Spoke with patient.  She is able to verify HIPAA.  Patient reports that she is doing well after her surgery and has gone for a follow up.   Patient does not have a PCP.  Discussed with patient the importance of having a PCP.  Patient also given information to secure a PCP.  She verbalized understanding.   Social: Patient lives with other family and she support. Patient reports that she is independent with all aspects of care.  Conditions: Patient with recent uterine prolapse repair.  Patient reports she had a foley post-op but now has been taken out. She declines any problems with voiding.    Medications: Patient does not take any prescribed medications.  Appointments: Patient went for a follow up on 02-15-18 and has another follow up on 04-01-18  Consent: RN CM [facility]. Patient declined services at this time.  Plan: RN CM will close case.     Bary Lericheionne J Shatori Bertucci, RN, MSN [facility] Care Management Care Management Coordinator Direct Line [phone] Toll Free: [phone]  Fax: [phone]

## 2018-04-01 DIAGNOSIS — Z9889 Other specified postprocedural states: Secondary | ICD-10-CM | POA: Diagnosis not present

## 2018-04-01 DIAGNOSIS — Z8742 Personal history of other diseases of the female genital tract: Secondary | ICD-10-CM | POA: Diagnosis not present

## 2024-06-08 ENCOUNTER — Other Ambulatory Visit (HOSPITAL_BASED_OUTPATIENT_CLINIC_OR_DEPARTMENT_OTHER): Payer: Self-pay | Admitting: Nurse Practitioner

## 2024-06-08 DIAGNOSIS — E2839 Other primary ovarian failure: Secondary | ICD-10-CM
